# Patient Record
Sex: Female | Born: 1965 | Race: Black or African American | Hispanic: No | Marital: Single | Smoking: Current every day smoker
Health system: Southern US, Community
[De-identification: ages and names within clinical notes are randomized; demographics above are authoritative.]

## PROBLEM LIST (undated history)

## (undated) DIAGNOSIS — F419 Anxiety disorder, unspecified: Secondary | ICD-10-CM

## (undated) DIAGNOSIS — K219 Gastro-esophageal reflux disease without esophagitis: Secondary | ICD-10-CM

## (undated) DIAGNOSIS — K279 Peptic ulcer, site unspecified, unspecified as acute or chronic, without hemorrhage or perforation: Secondary | ICD-10-CM

## (undated) DIAGNOSIS — Z720 Tobacco use: Secondary | ICD-10-CM

## (undated) HISTORY — DX: Peptic ulcer, site unspecified, unspecified as acute or chronic, without hemorrhage or perforation: K27.9

## (undated) HISTORY — DX: Tobacco use: Z72.0

---

## 1999-03-07 ENCOUNTER — Emergency Department (HOSPITAL_COMMUNITY): Admission: EM | Admit: 1999-03-07 | Discharge: 1999-03-08 | Payer: Self-pay | Admitting: Emergency Medicine

## 1999-09-28 ENCOUNTER — Other Ambulatory Visit: Admission: RE | Admit: 1999-09-28 | Discharge: 1999-09-28 | Payer: Self-pay | Admitting: *Deleted

## 2000-07-19 ENCOUNTER — Emergency Department (HOSPITAL_COMMUNITY): Admission: EM | Admit: 2000-07-19 | Discharge: 2000-07-19 | Payer: Self-pay | Admitting: Emergency Medicine

## 2000-07-19 ENCOUNTER — Encounter: Payer: Self-pay | Admitting: Emergency Medicine

## 2001-05-10 ENCOUNTER — Other Ambulatory Visit: Admission: RE | Admit: 2001-05-10 | Discharge: 2001-05-10 | Payer: Self-pay | Admitting: Family Medicine

## 2001-06-14 ENCOUNTER — Encounter: Admission: RE | Admit: 2001-06-14 | Discharge: 2001-06-14 | Payer: Self-pay | Admitting: *Deleted

## 2001-07-09 ENCOUNTER — Ambulatory Visit (HOSPITAL_COMMUNITY): Admission: RE | Admit: 2001-07-09 | Discharge: 2001-07-09 | Payer: Self-pay | Admitting: Obstetrics and Gynecology

## 2001-07-09 ENCOUNTER — Encounter: Payer: Self-pay | Admitting: Obstetrics and Gynecology

## 2002-05-26 ENCOUNTER — Emergency Department (HOSPITAL_COMMUNITY): Admission: EM | Admit: 2002-05-26 | Discharge: 2002-05-26 | Payer: Self-pay | Admitting: Emergency Medicine

## 2003-02-15 ENCOUNTER — Emergency Department (HOSPITAL_COMMUNITY): Admission: EM | Admit: 2003-02-15 | Discharge: 2003-02-15 | Payer: Self-pay | Admitting: *Deleted

## 2003-08-04 ENCOUNTER — Emergency Department (HOSPITAL_COMMUNITY): Admission: EM | Admit: 2003-08-04 | Discharge: 2003-08-04 | Payer: Self-pay | Admitting: Emergency Medicine

## 2003-11-24 ENCOUNTER — Emergency Department (HOSPITAL_COMMUNITY): Admission: EM | Admit: 2003-11-24 | Discharge: 2003-11-25 | Payer: Self-pay | Admitting: Emergency Medicine

## 2004-04-19 ENCOUNTER — Emergency Department (HOSPITAL_COMMUNITY): Admission: EM | Admit: 2004-04-19 | Discharge: 2004-04-19 | Payer: Self-pay | Admitting: Emergency Medicine

## 2005-08-29 ENCOUNTER — Emergency Department (HOSPITAL_COMMUNITY): Admission: EM | Admit: 2005-08-29 | Discharge: 2005-08-29 | Payer: Self-pay | Admitting: Emergency Medicine

## 2010-06-01 ENCOUNTER — Emergency Department (HOSPITAL_COMMUNITY)
Admission: EM | Admit: 2010-06-01 | Discharge: 2010-06-01 | Disposition: A | Discharge status: Home / Self Care | Payer: Self-pay | Attending: Emergency Medicine | Admitting: Emergency Medicine

## 2010-06-01 DIAGNOSIS — K2970 Gastritis, unspecified, without bleeding: Secondary | ICD-10-CM | POA: Insufficient documentation

## 2010-06-01 DIAGNOSIS — R1013 Epigastric pain: Secondary | ICD-10-CM | POA: Insufficient documentation

## 2010-06-01 DIAGNOSIS — N63 Unspecified lump in unspecified breast: Secondary | ICD-10-CM | POA: Insufficient documentation

## 2010-06-01 DIAGNOSIS — F17200 Nicotine dependence, unspecified, uncomplicated: Secondary | ICD-10-CM | POA: Insufficient documentation

## 2012-08-26 ENCOUNTER — Encounter (HOSPITAL_COMMUNITY): Payer: Self-pay | Admitting: Nurse Practitioner

## 2012-08-26 ENCOUNTER — Emergency Department (HOSPITAL_COMMUNITY)
Admission: EM | Admit: 2012-08-26 | Discharge: 2012-08-26 | Disposition: A | Discharge status: Home / Self Care | Payer: Self-pay | Attending: Emergency Medicine | Admitting: Emergency Medicine

## 2012-08-26 DIAGNOSIS — X58XXXA Exposure to other specified factors, initial encounter: Secondary | ICD-10-CM | POA: Insufficient documentation

## 2012-08-26 DIAGNOSIS — Z791 Long term (current) use of non-steroidal anti-inflammatories (NSAID): Secondary | ICD-10-CM | POA: Insufficient documentation

## 2012-08-26 DIAGNOSIS — F17200 Nicotine dependence, unspecified, uncomplicated: Secondary | ICD-10-CM | POA: Insufficient documentation

## 2012-08-26 DIAGNOSIS — Y9389 Activity, other specified: Secondary | ICD-10-CM | POA: Insufficient documentation

## 2012-08-26 DIAGNOSIS — S058X9A Other injuries of unspecified eye and orbit, initial encounter: Secondary | ICD-10-CM | POA: Insufficient documentation

## 2012-08-26 DIAGNOSIS — S0502XA Injury of conjunctiva and corneal abrasion without foreign body, left eye, initial encounter: Secondary | ICD-10-CM

## 2012-08-26 DIAGNOSIS — K219 Gastro-esophageal reflux disease without esophagitis: Secondary | ICD-10-CM | POA: Insufficient documentation

## 2012-08-26 DIAGNOSIS — Z79899 Other long term (current) drug therapy: Secondary | ICD-10-CM | POA: Insufficient documentation

## 2012-08-26 DIAGNOSIS — Z8659 Personal history of other mental and behavioral disorders: Secondary | ICD-10-CM | POA: Insufficient documentation

## 2012-08-26 DIAGNOSIS — Y929 Unspecified place or not applicable: Secondary | ICD-10-CM | POA: Insufficient documentation

## 2012-08-26 HISTORY — DX: Anxiety disorder, unspecified: F41.9

## 2012-08-26 HISTORY — DX: Gastro-esophageal reflux disease without esophagitis: K21.9

## 2012-08-26 MED ORDER — POLYMYXIN B-TRIMETHOPRIM 10000-0.1 UNIT/ML-% OP SOLN: 1.0000 [drp] | OPHTHALMIC | Status: DC

## 2012-08-26 MED ORDER — FLUORESCEIN SODIUM 1 MG OP STRP
ORAL_STRIP | OPHTHALMIC | Status: AC
  Filled 2012-08-26: qty 1

## 2012-08-26 MED ORDER — FLUORESCEIN SODIUM 1 MG OP STRP: 1.0000 | ORAL_STRIP | Freq: Once | OPHTHALMIC | Status: DC

## 2012-08-26 MED ORDER — FLUORESCEIN SODIUM 1 MG OP STRP
1.0000 | ORAL_STRIP | Freq: Once | OPHTHALMIC | Status: AC
  Administered 2012-08-26: 1 via OPHTHALMIC

## 2012-08-26 MED ORDER — TETRACAINE HCL 0.5 % OP SOLN
1.0000 [drp] | Freq: Once | OPHTHALMIC | Status: AC
  Administered 2012-08-26: 1 [drp] via OPHTHALMIC
  Filled 2012-08-26: qty 2

## 2012-08-26 MED ORDER — HYDROCODONE-ACETAMINOPHEN 5-325 MG PO TABS: 2.0000 | ORAL_TABLET | Freq: Four times a day (QID) | ORAL | Status: DC | PRN

## 2012-08-26 NOTE — ED Provider Notes (Signed)
History     CSN: [id number]  Arrival date & time 08/26/12  1357   First MD Initiated Contact with Patient 08/26/12 1442      Chief Complaint  Patient presents with  . Eye Pain    (Consider location/radiation/quality/duration/timing/severity/associated sxs/prior treatment) HPI Comments: Patient presents to the emergency department with chief complaint of irritated watery left eye. She states that started last night. She states that she has had a recent "head cold." She denies fevers, chills, nausea, or vomiting. She has not tried anything to alleviate her symptoms. She states that she has a history of irritated eyes. He states that it feels like she has something in her eye. She reports that it has been draining persistently. It is a clear watery drainage.  The history is provided by the patient. No language interpreter was used.    Past Medical History  Diagnosis Date  . Anxiety   . Acid reflux     History reviewed. No pertinent past surgical history.  History reviewed. No pertinent family history.  History  Substance Use Topics  . Smoking status: Current Every Day Smoker    Types: Cigarettes  . Smokeless tobacco: Not on file  . Alcohol Use: No    OB History   Grav Para Term Preterm Abortions TAB SAB Ect Mult Living                  Review of Systems  All other systems reviewed and are negative.    Allergies  Review of patient's allergies indicates no known allergies.  Home Medications   Current Outpatient Rx  Name  Route  Sig  Dispense  Refill  . cholecalciferol (VITAMIN D) 1000 UNITS tablet   Oral   Take 1,000 Units by mouth 2 (two) times daily.         . meloxicam (MOBIC) 15 MG tablet   Oral   Take 15 mg by mouth daily.         . pantoprazole (PROTONIX) 40 MG tablet   Oral   Take 40 mg by mouth daily.         . ranitidine (ZANTAC) 150 MG capsule   Oral   Take 150 mg by mouth once.         Marland Kitchen zolpidem (AMBIEN) 5 MG tablet   Oral  Take 5 mg by mouth at bedtime as needed for sleep.           BP 128/78  Pulse 63  Temp(Src) 97.3 F (36.3 C) (Oral)  Resp 15  SpO2 99%  Physical Exam  Nursing note and vitals reviewed. Constitutional: She is oriented to person, place, and time. She appears well-developed and well-nourished.  HENT:  Head: Normocephalic and atraumatic.  Eyes: Conjunctivae and EOM are normal.    No obvious foreign bodies, small area of fluorescein uptake, eyelid everted, Visual Acuity  -  Bilateral Distance:  20/30 ; R Distance:  20/30 ; L Distance:  20/30  Neck: Normal range of motion.  Cardiovascular: Normal rate.   Pulmonary/Chest: Effort normal.  Abdominal: She exhibits no distension.  Musculoskeletal: Normal range of motion.  Neurological: She is alert and oriented to person, place, and time.  Skin: Skin is dry.  Psychiatric: She has a normal mood and affect. Her behavior is normal. Judgment and thought content normal.    ED Course  Procedures (including critical care time)  Labs Reviewed - No data to display No results found.   1. Corneal abrasion, left,  initial encounter       MDM  Patient with area of fluorescein uptake, concern for abrasion. Will treat with Polytrim eyedrops, and Vicodin. Followup with ophthalmology tomorrow.        Roxy Horseman, PA-C 08/26/12 1533

## 2012-08-26 NOTE — ED Notes (Signed)
Pt c/o pain, burning, and blurred vision from L eye since lastnight. Denies any injuries. States she recently had a "head cold."

## 2012-08-28 NOTE — ED Provider Notes (Signed)
Medical screening examination/treatment/procedure(s) were performed by non-physician practitioner and as supervising physician I was immediately available for consultation/collaboration.  Derwood Kaplan, MD 08/28/12 [phone]

## 2012-09-12 ENCOUNTER — Other Ambulatory Visit: Payer: Self-pay | Admitting: Obstetrics and Gynecology

## 2012-09-12 DIAGNOSIS — Z1231 Encounter for screening mammogram for malignant neoplasm of breast: Secondary | ICD-10-CM

## 2012-09-25 ENCOUNTER — Inpatient Hospital Stay (HOSPITAL_COMMUNITY): Admission: RE | Admit: 2012-09-25 | Payer: Self-pay | Source: Ambulatory Visit

## 2012-09-25 ENCOUNTER — Ambulatory Visit (HOSPITAL_COMMUNITY): Payer: Self-pay | Attending: Obstetrics and Gynecology

## 2012-12-22 ENCOUNTER — Other Ambulatory Visit (HOSPITAL_COMMUNITY): Payer: Self-pay | Admitting: *Deleted

## 2012-12-22 DIAGNOSIS — N6320 Unspecified lump in the left breast, unspecified quadrant: Secondary | ICD-10-CM

## 2012-12-25 ENCOUNTER — Ambulatory Visit (HOSPITAL_COMMUNITY)
Admission: RE | Admit: 2012-12-25 | Discharge: 2012-12-25 | Disposition: A | Discharge status: Home / Self Care | Payer: Self-pay | Source: Ambulatory Visit | Attending: Obstetrics and Gynecology | Admitting: Obstetrics and Gynecology

## 2012-12-25 ENCOUNTER — Encounter (HOSPITAL_COMMUNITY): Payer: Self-pay

## 2012-12-25 ENCOUNTER — Encounter (INDEPENDENT_AMBULATORY_CARE_PROVIDER_SITE_OTHER): Payer: Self-pay

## 2012-12-25 VITALS — BP 116/68 | Temp 98.4°F | Ht 66.0 in | Wt 158.2 lb

## 2012-12-25 DIAGNOSIS — Z01419 Encounter for gynecological examination (general) (routine) without abnormal findings: Secondary | ICD-10-CM

## 2012-12-25 NOTE — Patient Instructions (Signed)
Taught Anita Obrien how to perform BSE and gave educational materials to take home.  Let her know [facility] will cover Pap smears every 3 years unless has a history of abnormal Pap smears. Referred patient to the [facility] for diagnostic mammogram. Appointment scheduled for Monday, December 31, 2012 at 1345. Patient aware of appointment and will be there. Let patient know will follow up with her within the next couple weeks with results for Pap smear by phone. Anita Obrien verbalized understanding.  Anita Obrien, Anita Maser, RN 8:46 AM

## 2012-12-25 NOTE — Progress Notes (Signed)
Complaints of left breast lump x 2 years that patient states has gotten larger within the last month. Patient complains of left breast pain that comes and goes that started a month ago. Patient rates pain at a 7 out of 10.  Pap Smear:    Pap smear completed today. Patients last Pap smear was 4 years ago at [facility] and normal per patient. Per patient no history of an abnormal Pap smear. No Pap smear results in EPIC.  Physical exam: Breasts Breasts symmetrical. No skin abnormalities right breast. Darkened area observed on left breast at 9 o'clock next to areola. Nipple retraction bilateral breasts per patient is normal for her. No nipple discharge bilateral breasts. No lymphadenopathy. No lumps palpated bilateral breasts. No lumps were palpated at patients area of concern. Patient complained of some tenderness when palpated left breast at areola and at 9 o'clock where area of darkened skin observed.Referred patient to the [facility] for diagnostic mammogram. Appointment scheduled for Monday, December 31, 2012 at 1345.          Pelvic/Bimanual   Ext Genitalia No lesions, no swelling and no discharge observed on external genitalia.         Vagina Vagina pink and normal texture. No lesions or discharge observed in vagina.          Cervix Cervix is present. Cervix pink and of normal texture. Cervix friable. No discharge observed.     Uterus Uterus is present and palpable. Uterus in normal position and normal size.        Adnexae Bilateral ovaries present and palpable. No tenderness on palpation.          Rectovaginal No rectal exam completed today since patient had no rectal complaints. No skin abnormalities observed on exam.

## 2012-12-31 ENCOUNTER — Other Ambulatory Visit: Payer: Self-pay

## 2013-01-04 ENCOUNTER — Telehealth (HOSPITAL_COMMUNITY): Payer: Self-pay | Admitting: *Deleted

## 2013-01-04 NOTE — Telephone Encounter (Signed)
Telephoned patient at home # and discussed negative pap smear results. Next pap smear due in 3 years. Patient voiced understanding.  

## 2013-01-16 ENCOUNTER — Ambulatory Visit
Admission: RE | Admit: 2013-01-16 | Discharge: 2013-01-16 | Disposition: A | Discharge status: Home / Self Care | Payer: No Typology Code available for payment source | Source: Ambulatory Visit | Attending: Obstetrics and Gynecology | Admitting: Obstetrics and Gynecology

## 2013-01-16 DIAGNOSIS — N6320 Unspecified lump in the left breast, unspecified quadrant: Secondary | ICD-10-CM

## 2014-01-06 ENCOUNTER — Encounter (HOSPITAL_COMMUNITY): Payer: Self-pay

## 2014-08-30 ENCOUNTER — Emergency Department (HOSPITAL_COMMUNITY): Payer: Managed Care, Other (non HMO)

## 2014-08-30 ENCOUNTER — Encounter (HOSPITAL_COMMUNITY): Payer: Self-pay | Admitting: General Practice

## 2014-08-30 ENCOUNTER — Emergency Department (HOSPITAL_COMMUNITY)
Admission: EM | Admit: 2014-08-30 | Discharge: 2014-08-30 | Disposition: A | Discharge status: Home / Self Care | Payer: Managed Care, Other (non HMO) | Attending: Emergency Medicine | Admitting: Emergency Medicine

## 2014-08-30 DIAGNOSIS — Z720 Tobacco use: Secondary | ICD-10-CM | POA: Insufficient documentation

## 2014-08-30 DIAGNOSIS — Z79899 Other long term (current) drug therapy: Secondary | ICD-10-CM | POA: Insufficient documentation

## 2014-08-30 DIAGNOSIS — K219 Gastro-esophageal reflux disease without esophagitis: Secondary | ICD-10-CM | POA: Insufficient documentation

## 2014-08-30 DIAGNOSIS — R1084 Generalized abdominal pain: Secondary | ICD-10-CM | POA: Diagnosis present

## 2014-08-30 DIAGNOSIS — F419 Anxiety disorder, unspecified: Secondary | ICD-10-CM | POA: Diagnosis not present

## 2014-08-30 DIAGNOSIS — R05 Cough: Secondary | ICD-10-CM | POA: Insufficient documentation

## 2014-08-30 DIAGNOSIS — R112 Nausea with vomiting, unspecified: Secondary | ICD-10-CM | POA: Diagnosis not present

## 2014-08-30 DIAGNOSIS — R197 Diarrhea, unspecified: Secondary | ICD-10-CM | POA: Insufficient documentation

## 2014-08-30 DIAGNOSIS — M791 Myalgia: Secondary | ICD-10-CM | POA: Insufficient documentation

## 2014-08-30 DIAGNOSIS — K029 Dental caries, unspecified: Secondary | ICD-10-CM | POA: Insufficient documentation

## 2014-08-30 DIAGNOSIS — Z791 Long term (current) use of non-steroidal anti-inflammatories (NSAID): Secondary | ICD-10-CM | POA: Insufficient documentation

## 2014-08-30 DIAGNOSIS — R059 Cough, unspecified: Secondary | ICD-10-CM

## 2014-08-30 LAB — CBC WITH DIFFERENTIAL/PLATELET
BASOS ABS: 0 10*3/uL (ref 0.0–0.1)
BASOS PCT: 0 % (ref 0–1)
Eosinophils Absolute: 0.1 10*3/uL (ref 0.0–0.7)
Eosinophils Relative: 2 % (ref 0–5)
HEMATOCRIT: 38.1 % (ref 36.0–46.0)
Hemoglobin: 12.8 g/dL (ref 12.0–15.0)
LYMPHS ABS: 2 10*3/uL (ref 0.7–4.0)
Lymphocytes Relative: 32 % (ref 12–46)
MCH: 31.5 pg (ref 26.0–34.0)
MCHC: 33.6 g/dL (ref 30.0–36.0)
MCV: 93.8 fL (ref 78.0–100.0)
MONO ABS: 0.7 10*3/uL (ref 0.1–1.0)
Monocytes Relative: 10 % (ref 3–12)
NEUTROS ABS: 3.5 10*3/uL (ref 1.7–7.7)
Neutrophils Relative %: 56 % (ref 43–77)
Platelets: 333 10*3/uL (ref 150–400)
RBC: 4.06 MIL/uL (ref 3.87–5.11)
RDW: 12.8 % (ref 11.5–15.5)
WBC: 6.3 10*3/uL (ref 4.0–10.5)

## 2014-08-30 LAB — COMPREHENSIVE METABOLIC PANEL
ALBUMIN: 3.8 g/dL (ref 3.5–5.0)
ALK PHOS: 78 U/L (ref 38–126)
ALT: 13 U/L — ABNORMAL LOW (ref 14–54)
ANION GAP: 6 (ref 5–15)
AST: 22 U/L (ref 15–41)
BUN: 16 mg/dL (ref 6–20)
CHLORIDE: 107 mmol/L (ref 101–111)
CO2: 25 mmol/L (ref 22–32)
Calcium: 9.3 mg/dL (ref 8.9–10.3)
Creatinine, Ser: 0.93 mg/dL (ref 0.44–1.00)
GFR calc Af Amer: >60 mL/min (ref >60)
GFR calc non Af Amer: >60 mL/min (ref >60)
Glucose, Bld: 99 mg/dL (ref 65–99)
Potassium: 3.8 mmol/L (ref 3.5–5.1)
SODIUM: 138 mmol/L (ref 135–145)
Total Bilirubin: 0.4 mg/dL (ref 0.3–1.2)
Total Protein: 7.2 g/dL (ref 6.5–8.1)

## 2014-08-30 LAB — I-STAT TROPONIN, ED
TROPONIN I, POC: 0.02 ng/mL (ref 0.00–0.08)
Troponin i, poc: 0 ng/mL (ref 0.00–0.08)

## 2014-08-30 LAB — I-STAT CG4 LACTIC ACID, ED: Lactic Acid, Venous: 0.71 mmol/L (ref 0.5–2.0)

## 2014-08-30 LAB — LIPASE, BLOOD: Lipase: 21 U/L — ABNORMAL LOW (ref 22–51)

## 2014-08-30 MED ORDER — MORPHINE SULFATE 4 MG/ML IJ SOLN
4.0000 mg | Freq: Once | INTRAMUSCULAR | Status: AC
  Administered 2014-08-30: 4 mg via INTRAVENOUS
  Filled 2014-08-30: qty 1

## 2014-08-30 MED ORDER — SODIUM CHLORIDE 0.9 % IV BOLUS (SEPSIS)
1000.0000 mL | Freq: Once | INTRAVENOUS | Status: AC
  Administered 2014-08-30: 1000 mL via INTRAVENOUS

## 2014-08-30 MED ORDER — ONDANSETRON HCL 4 MG PO TABS: 4.0000 mg | ORAL_TABLET | Freq: Four times a day (QID) | ORAL | Status: DC

## 2014-08-30 MED ORDER — PANTOPRAZOLE SODIUM 40 MG PO TBEC: 40.0000 mg | DELAYED_RELEASE_TABLET | Freq: Every day | ORAL | Status: DC

## 2014-08-30 MED ORDER — ONDANSETRON HCL 4 MG/2ML IJ SOLN
4.0000 mg | Freq: Once | INTRAMUSCULAR | Status: AC
  Administered 2014-08-30: 4 mg via INTRAVENOUS
  Filled 2014-08-30: qty 2

## 2014-08-30 MED ORDER — RANITIDINE HCL 150 MG PO CAPS: 150.0000 mg | ORAL_CAPSULE | Freq: Two times a day (BID) | ORAL | Status: DC

## 2014-08-30 NOTE — ED Notes (Signed)
EDP at bedside  

## 2014-08-30 NOTE — ED Notes (Signed)
Pt comfortable with discharge and follow up instructions. Pt declines wheelchair, escorted to waiting area by this RN. Prescriptions x3. 

## 2014-08-30 NOTE — ED Provider Notes (Signed)
CSN: [id number]     Arrival date & time 08/30/14  1021 History   First MD Initiated Contact with Patient 08/30/14 1032     No chief complaint on file.    (Consider location/radiation/quality/duration/timing/severity/associated sxs/prior Treatment) HPI Comments: Pt felt fine all day and then last night started to have vomiting with blood streaks and diarrhea which was normal in color.  She had multiple episodes of vomiting throughout the night and diarrhea.  She is having diffuse abd pain which started after the vomiting.  Also when vomits coughs and there is some blood in it.  She states after several episodes of vomiting she developed substernal chest pain.  Denies SOB and not currently having pain in the chest.  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 She takes no medication at this time except for occasional Goody's powder. She does have a history of stomach ulcers but no longer takes Protonix or Zantac.  She states her diarrhea is light in color without any black stools or blood in the stool.  Patient denies any urinary symptoms or vaginal discharge.  Patient is a 49 y.o. female presenting with vomiting. The history is provided by the patient.  Emesis Severity:  Severe Duration:  12 hours Timing:  Constant Number of daily episodes:  Numerous Quality:  Stomach contents and bright red blood (blood streaked vomit.  No clots or large volume of blood.  Does not turn the toilet bowl red) Progression:  Unchanged Chronicity:  New Relieved by:  Nothing Worsened by:  Ice chips and liquids Ineffective treatments:  None tried Associated symptoms: abdominal pain, cough, diarrhea and myalgias   Associated symptoms: no fever, no sore throat and no URI   Risk factors: alcohol use   Risk factors: no diabetes, not pregnant now, no prior abdominal surgery and no sick contacts   Risk factors comment:  Drinks a 40oz a day for 20 years   Past Medical History  Diagnosis Date  .  Anxiety   . Acid reflux    No past surgical history on file. Family History  Problem Relation Age of Onset  . Breast cancer Sister    History  Substance Use Topics  . Smoking status: Current Every Day Smoker    Types: Cigarettes  . Smokeless tobacco: Not on file  . Alcohol Use: No   OB History    Gravida Para Term Preterm AB TAB SAB Ectopic Multiple Living   2 2 2       2      Review of Systems  HENT: Negative for sore throat.   Gastrointestinal: Positive for vomiting, abdominal pain and diarrhea.  Musculoskeletal: Positive for myalgias.  All other systems reviewed and are negative.     Allergies  Review of patient's allergies indicates no known allergies.  Home Medications   Prior to Admission medications   Medication Sig Start Date End Date Taking? Authorizing Provider  cholecalciferol (VITAMIN D) 1000 UNITS tablet Take 1,000 Units by mouth 2 (two) times daily.    Historical Provider, MD  HYDROcodone-acetaminophen (NORCO/VICODIN) 5-325 MG per tablet Take 2 tablets by mouth every 6 (six) hours as needed for pain. 08/26/12   Roxy Horseman, PA-C  meloxicam (MOBIC) 15 MG tablet Take 15 mg by mouth daily.    Historical Provider, MD  pantoprazole (PROTONIX) 40 MG tablet Take 40 mg by mouth daily.    Historical Provider, MD  ranitidine (ZANTAC) 150 MG capsule Take 150 mg by mouth once.  Historical Provider, MD  trimethoprim-polymyxin b (POLYTRIM) ophthalmic solution Place 1 drop into the left eye every 4 (four) hours. 08/26/12   Roxy Horseman, PA-C  zolpidem (AMBIEN) 5 MG tablet Take 5 mg by mouth at bedtime as needed for sleep.    Historical Provider, MD   There were no vitals taken for this visit. Physical Exam  Constitutional: She is oriented to person, place, and time. She appears well-developed and well-nourished. No distress.  HENT:  Head: Normocephalic and atraumatic.  Mouth/Throat: Oropharynx is clear and moist. Mucous membranes are dry. Dental caries present.      Eyes: Conjunctivae and EOM are normal. Pupils are equal, round, and reactive to light.  Neck: Normal range of motion. Neck supple.  Cardiovascular: Normal rate, regular rhythm and intact distal pulses.   No murmur heard. Pulmonary/Chest: Effort normal and breath sounds normal. No respiratory distress. She has no wheezes. She has no rales.  Abdominal: Soft. She exhibits no distension. There is tenderness. There is no rebound and no guarding.  Mild diffuse tenderness.  Musculoskeletal: Normal range of motion. She exhibits no edema or tenderness.  Neurological: She is alert and oriented to person, place, and time.  Skin: Skin is warm and dry. No rash noted. No erythema.  Psychiatric: She has a normal mood and affect. Her behavior is normal.  Nursing note and vitals reviewed.   ED Course  Procedures (including critical care time) Labs Review Labs Reviewed  COMPREHENSIVE METABOLIC PANEL - Abnormal; Notable for the following:    ALT 13 (*)    All other components within normal limits  LIPASE, BLOOD - Abnormal; Notable for the following:    Lipase 21 (*)    All other components within normal limits  CBC WITH DIFFERENTIAL/PLATELET  I-STAT TROPOININ, ED  I-STAT CG4 LACTIC ACID, ED  Rosezena Sensor, ED    Imaging Review Dg Chest 2 View  08/30/2014   CLINICAL DATA:  Hemoptysis and midsternal chest pain since yesterday.  EXAM: CHEST  2 VIEW  COMPARISON:  None available  FINDINGS: The heart size and mediastinal contours are within normal limits. Both lungs are clear. The visualized skeletal structures are unremarkable.  IMPRESSION: No active cardiopulmonary disease.   Electronically Signed   By: Judie Petit.  Shick M.D.   On: 08/30/2014 12:23     EKG Interpretation   Date/Time:  Saturday August 30 2014 11:15:40 EDT Ventricular Rate:  70 PR Interval:  160 QRS Duration: 138 QT Interval:  496 QTC Calculation: 535 R Axis:   43 Text Interpretation:  Sinus rhythm Left bundle branch block No  previous  tracing Confirmed by Anitra Lauth  MD, Alphonzo Lemmings ([id number]) on 08/30/2014 11:18:36  AM      MDM   Final diagnoses:  None    Patient with a history of vomiting and diarrhea that started last night with diffuse abdominal tenderness. Patient states that she is vomiting there is blood streaks in her vomit but denies copious blood, blood clots are dark red blood in her vomit. She denies black stools and her diarrhea is brown in color without blood. She does drink a 40 of beer every day for the last 20 years and uses cigarettes. She denies any abdominal surgeries. She currently takes no medications and occasionally will use a Goody's powder or ibuprofen but not regularly.  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 No allergies.  Patient's symptoms seem most closely related to a viral etiology however potential for pancreatitis, hepatitis. Based on  exam and symptoms low suspicion for cholecystitis or appendicitis. Patient denies any urinary symptoms or vaginal discharge. Low suspicion that this is variceal bleeding as patient just has blood streaks which is most likely a Mallory-Weiss tear.  Also patient states she started developing left-sided chest pain after multiple episodes of vomiting. She denies any shortness of breath.  No current chest pain now.  CBC, CMP, lipase, troponin, lactate, EKG pending.  Patient given IV fluids and Zofran.  12:20 PM All labs are within normal limits. Patient does have a left bundle branch block on EKG without prior to compare however this time she has no chest pain and low suspicion that her left bundle branch block is caused by an acute coronary syndrome at this time.  1:07 PM On reevaluation patient is feeling better but states the pain returned in her left chest which is reproducible by palpation. She states occasionally she will get this pain over the last year intermittently it's never caused by exertion and she denies any shortness of breath  associated with it. Will repeat a troponin and give patient a fluid challenge.  3:24 PM Second trop neg.  Pt tolerating po's.  Will d/c home.  Gwyneth Sprout, MD 08/30/14 1524

## 2014-08-30 NOTE — ED Notes (Signed)
Pt unable to complete fluid challenge.

## 2014-08-30 NOTE — ED Notes (Signed)
Patient transported to X-ray 

## 2014-08-30 NOTE — ED Notes (Signed)
Pt complaining of abdominal pain N/V/D. Pt took a goody powder yesterday around 1900 and around 2030 pt starting having N/V. Pt reports vomited about 7 times since then, and 2 diarrhea occurences. Pt denies any fever or chills. Pt is A/O. Pt not able to tolerate fluids or solids.

## 2014-10-20 ENCOUNTER — Encounter (HOSPITAL_COMMUNITY): Payer: Self-pay

## 2014-10-20 ENCOUNTER — Emergency Department (HOSPITAL_COMMUNITY): Payer: Managed Care, Other (non HMO)

## 2014-10-20 ENCOUNTER — Emergency Department (HOSPITAL_COMMUNITY)
Admission: EM | Admit: 2014-10-20 | Discharge: 2014-10-20 | Disposition: A | Discharge status: Home / Self Care | Payer: Managed Care, Other (non HMO) | Attending: Emergency Medicine | Admitting: Emergency Medicine

## 2014-10-20 DIAGNOSIS — Z791 Long term (current) use of non-steroidal anti-inflammatories (NSAID): Secondary | ICD-10-CM | POA: Diagnosis not present

## 2014-10-20 DIAGNOSIS — Z79899 Other long term (current) drug therapy: Secondary | ICD-10-CM | POA: Diagnosis not present

## 2014-10-20 DIAGNOSIS — Z720 Tobacco use: Secondary | ICD-10-CM | POA: Diagnosis not present

## 2014-10-20 DIAGNOSIS — R079 Chest pain, unspecified: Secondary | ICD-10-CM | POA: Diagnosis present

## 2014-10-20 DIAGNOSIS — Z8659 Personal history of other mental and behavioral disorders: Secondary | ICD-10-CM | POA: Diagnosis not present

## 2014-10-20 DIAGNOSIS — K219 Gastro-esophageal reflux disease without esophagitis: Secondary | ICD-10-CM | POA: Insufficient documentation

## 2014-10-20 DIAGNOSIS — K2970 Gastritis, unspecified, without bleeding: Secondary | ICD-10-CM | POA: Diagnosis not present

## 2014-10-20 LAB — CBC
HEMATOCRIT: 40.1 % (ref 36.0–46.0)
HEMOGLOBIN: 13.7 g/dL (ref 12.0–15.0)
MCH: 32.4 pg (ref 26.0–34.0)
MCHC: 34.2 g/dL (ref 30.0–36.0)
MCV: 94.8 fL (ref 78.0–100.0)
Platelets: 344 10*3/uL (ref 150–400)
RBC: 4.23 MIL/uL (ref 3.87–5.11)
RDW: 13.2 % (ref 11.5–15.5)
WBC: 6.3 10*3/uL (ref 4.0–10.5)

## 2014-10-20 LAB — BASIC METABOLIC PANEL
ANION GAP: 9 (ref 5–15)
BUN: 11 mg/dL (ref 6–20)
CO2: 21 mmol/L — ABNORMAL LOW (ref 22–32)
Calcium: 9 mg/dL (ref 8.9–10.3)
Chloride: 104 mmol/L (ref 101–111)
Creatinine, Ser: 1.02 mg/dL — ABNORMAL HIGH (ref 0.44–1.00)
Glucose, Bld: 98 mg/dL (ref 65–99)
POTASSIUM: 3.8 mmol/L (ref 3.5–5.1)
SODIUM: 134 mmol/L — AB (ref 135–145)

## 2014-10-20 LAB — I-STAT TROPONIN, ED
TROPONIN I, POC: 0 ng/mL (ref 0.00–0.08)
Troponin i, poc: 0 ng/mL (ref 0.00–0.08)

## 2014-10-20 MED ORDER — GI COCKTAIL ~~LOC~~: 30.0000 mL | Freq: Two times a day (BID) | ORAL | Status: DC

## 2014-10-20 MED ORDER — PANTOPRAZOLE SODIUM 20 MG PO TBEC: 20.0000 mg | DELAYED_RELEASE_TABLET | Freq: Every day | ORAL | Status: DC

## 2014-10-20 MED ORDER — GI COCKTAIL ~~LOC~~
30.0000 mL | Freq: Once | ORAL | Status: AC
  Administered 2014-10-20: 30 mL via ORAL
  Filled 2014-10-20: qty 30

## 2014-10-20 MED ORDER — PANTOPRAZOLE SODIUM 40 MG IV SOLR
40.0000 mg | INTRAVENOUS | Status: AC
  Administered 2014-10-20: 40 mg via INTRAVENOUS
  Filled 2014-10-20: qty 40

## 2014-10-20 NOTE — ED Notes (Signed)
Pt states she has ulcers and started having cp yesterday morning and has bothered her all day. Takes zantac 150 and it usually helps but it didn't this time.

## 2014-10-20 NOTE — ED Notes (Signed)
Pt stable, ambulatory, states understanding of discharge instructions 

## 2014-10-20 NOTE — ED Provider Notes (Signed)
CSN: [id number]     Arrival date & time 10/20/14  0053 History   First MD Initiated Contact with Patient 10/20/14 0434     Chief Complaint  Patient presents with  . Chest Pain     (Consider location/radiation/quality/duration/timing/severity/associated sxs/prior Treatment) HPI Comments: Patient is a 49 year old female with a past medical history of anxiety, gastric ulcers and acid reflux who presents with a 2 day history chest pain. The pain is located in her central chest and radiates up and down the center of her chest. The pain is described as burning and severe. The pain started gradually and progressively worsened since the onset. No alleviating/aggravating factors. The patient has tried zantac for symptoms without relief. Associated symptoms include nothing. Patient denies fever, headache, NVD, SOB, dysuria, constipation.  Patient is a 49 y.o. female presenting with chest pain.  Chest Pain Associated symptoms: no abdominal pain, no dizziness, no dysphagia, no fatigue, no fever, no nausea, no palpitations, no shortness of breath, not vomiting and no weakness     Past Medical History  Diagnosis Date  . Anxiety   . Acid reflux    History reviewed. No pertinent past surgical history. Family History  Problem Relation Age of Onset  . Breast cancer Sister    Social History  Substance Use Topics  . Smoking status: Current Every Day Smoker -- 0.50 packs/day    Types: Cigarettes  . Smokeless tobacco: None  . Alcohol Use: 2.4 oz/week    4 Cans of beer per week   OB History    Gravida Para Term Preterm AB TAB SAB Ectopic Multiple Living   Review of Systems  Constitutional: Negative for fever, chills and fatigue.  HENT: Negative for trouble swallowing.   Eyes: Negative for visual disturbance.  Respiratory: Negative for shortness of breath.   Cardiovascular: Positive for chest pain. Negative for palpitations.  Gastrointestinal: Negative for nausea, vomiting,  abdominal pain and diarrhea.  Genitourinary: Negative for dysuria and difficulty urinating.  Musculoskeletal: Negative for arthralgias and neck pain.  Skin: Negative for color change.  Neurological: Negative for dizziness and weakness.  Psychiatric/Behavioral: Negative for dysphoric mood.      Allergies  Review of patient's allergies indicates no known allergies.  Home Medications   Prior to Admission medications   Medication Sig Start Date End Date Taking? Authorizing Provider  cholecalciferol (VITAMIN D) 1000 UNITS tablet Take 1,000 Units by mouth 2 (two) times daily.   Yes Historical Provider, MD  meloxicam (MOBIC) 15 MG tablet Take 15 mg by mouth daily.   Yes Historical Provider, MD  pantoprazole (PROTONIX) 40 MG tablet Take 1 tablet (40 mg total) by mouth daily. 08/30/14  Yes Gwyneth Sprout, MD  ranitidine (ZANTAC) 150 MG capsule Take 1 capsule (150 mg total) by mouth 2 (two) times daily. 08/30/14  Yes Gwyneth Sprout, MD  zolpidem (AMBIEN) 5 MG tablet Take 5 mg by mouth at bedtime as needed for sleep.   Yes Historical Provider, MD  HYDROcodone-acetaminophen (NORCO/VICODIN) 5-325 MG per tablet Take 2 tablets by mouth every 6 (six) hours as needed for pain. Patient not taking: Reported on 10/20/2014 08/26/12   Roxy Horseman, PA-C  ondansetron (ZOFRAN) 4 MG tablet Take 1 tablet (4 mg total) by mouth every 6 (six) hours. Patient not taking: Reported on 10/20/2014 08/30/14   Gwyneth Sprout, MD  trimethoprim-polymyxin b (POLYTRIM) ophthalmic solution Place 1 drop into the left eye  every 4 (four) hours. Patient not taking: Reported on 10/20/2014 08/26/12   Roxy Horseman, PA-C   BP 110/66 mmHg  Pulse 65  Temp(Src) 98.2 F (36.8 C) (Oral)  Resp 15  Ht 5\' 7"  (1.702 m)  Wt 165 lb (74.844 kg)  BMI 25.84 kg/m2  SpO2 100%  LMP 08/16/2014 Physical Exam  Constitutional: She is oriented to person, place, and time. She appears well-developed and well-nourished. No distress.  HENT:   Head: Normocephalic and atraumatic.  Eyes: Conjunctivae and EOM are normal.  Neck: Normal range of motion.  Cardiovascular: Normal rate and regular rhythm.  Exam reveals no gallop and no friction rub.   No murmur heard. Pulmonary/Chest: Effort normal and breath sounds normal. She has no wheezes. She has no rales. She exhibits no tenderness.  Abdominal: Soft. She exhibits no distension. There is no tenderness. There is no rebound.  Musculoskeletal: Normal range of motion.  Neurological: She is alert and oriented to person, place, and time. Coordination normal.  Speech is goal-oriented. Moves limbs without ataxia.   Skin: Skin is warm and dry.  Psychiatric: She has a normal mood and affect. Her behavior is normal.  Nursing note and vitals reviewed.   ED Course  Procedures (including critical care time) Labs Review Labs Reviewed  BASIC METABOLIC PANEL - Abnormal; Notable for the following:    Sodium 134 (*)    CO2 21 (*)    Creatinine, Ser 1.02 (*)    All other components within normal limits  CBC  I-STAT TROPOININ, ED    Imaging Review Dg Chest 2 View  10/20/2014   CLINICAL DATA:  Shortness of breath and chest pain  EXAM: CHEST  2 VIEW  COMPARISON:  08/30/2014  FINDINGS: Normal heart size and mediastinal contours. No acute infiltrate or edema. No effusion or pneumothorax. No acute osseous findings.  IMPRESSION: No active cardiopulmonary disease.   Electronically Signed   By: Marnee Spring M.D.   On: 10/20/2014 02:16   I, Emilia Beck, personally reviewed and evaluated these images and lab results as part of my medical decision-making.   EKG Interpretation None      MDM   Final diagnoses:  Gastritis    6:09 AM Patient's labs unremarkable for acute changes. Chest xray unremarkable for acute changes. Vitals stable and patient afebrile. Patient's pain likely due to gastric ulcer. Patient treated with GI cocktail and protonix.     Emilia Beck, PA-C 10/21/14  0601  April Palumbo, MD 10/21/14 [phone]

## 2014-10-20 NOTE — Discharge Instructions (Signed)
Take protonix daily for pain. Take GI cocktail as needed. Refer to attached documents for more information.

## 2016-10-23 ENCOUNTER — Ambulatory Visit (HOSPITAL_COMMUNITY)
Admission: EM | Admit: 2016-10-23 | Discharge: 2016-10-23 | Disposition: A | Discharge status: Home / Self Care | Payer: Self-pay | Attending: Family Medicine | Admitting: Family Medicine

## 2016-10-23 ENCOUNTER — Encounter (HOSPITAL_COMMUNITY): Payer: Self-pay | Admitting: Emergency Medicine

## 2016-10-23 DIAGNOSIS — K219 Gastro-esophageal reflux disease without esophagitis: Secondary | ICD-10-CM

## 2016-10-23 MED ORDER — ONDANSETRON 4 MG PO TBDP
4.0000 mg | ORAL_TABLET | Freq: Once | ORAL | Status: AC
  Administered 2016-10-23: 4 mg via ORAL

## 2016-10-23 MED ORDER — RANITIDINE HCL 300 MG PO TABS: 300.0000 mg | ORAL_TABLET | Freq: Every day | ORAL | 2 refills | Status: DC

## 2016-10-23 MED ORDER — GI COCKTAIL ~~LOC~~
30.0000 mL | Freq: Once | ORAL | Status: AC
  Administered 2016-10-23: 30 mL via ORAL

## 2016-10-23 MED ORDER — ONDANSETRON 4 MG PO TBDP
ORAL_TABLET | ORAL | Status: AC
  Filled 2016-10-23: qty 1

## 2016-10-23 MED ORDER — GI COCKTAIL ~~LOC~~
ORAL | Status: AC
  Filled 2016-10-23: qty 30

## 2016-10-23 MED ORDER — OMEPRAZOLE 40 MG PO CPDR: 40.0000 mg | DELAYED_RELEASE_CAPSULE | Freq: Two times a day (BID) | ORAL | 0 refills | Status: DC

## 2016-10-23 NOTE — Discharge Instructions (Signed)
For your knee pain, this is consistent with arthritis, take Tylenol as needed, rest, eating patterns as needed.  For your reflux, avoid nonsteroidals such as ibuprofen, Motrin, Aleve, given the 2 week supply of Prilosec, take twice daily, and I have increased her Zantac dosage. Take one tablet every night at bedtime. If pain persists, follow-up with you primary care provider

## 2016-10-23 NOTE — ED Provider Notes (Signed)
  [facility]   [id number] 10/23/16 Arrival Time: 1544   SUBJECTIVE:  Anita Obrien is a 51 y.o. female who presents to the urgent care  with complaint of stomach pains ongoing for 6 days. She has a past history of stomach ulcer, takes Zantac 150 mg daily. States in the past this has helped but no longer. Pain is worse when she is lying supine, and after certain foods. Also states a sensation of fullness in her throat when she is lying down. She is also complaining of knee pain that started today. This is bilateral both knees, worse with movement. Denies any history of trauma. She works as a Advertising copywriter, states that she did not go to work today due to the pain.  ROS: As per HPI, remainder of ROS negative.   OBJECTIVE:  Vitals:   10/23/16 1556  BP: (!) 108/59  Pulse: 72  Resp: 18  Temp: 98.3 F (36.8 C)  TempSrc: Oral  SpO2: 98%     General appearance: alert; no distress HEENT: normocephalic; atraumatic; conjunctivae normal;  Lungs: clear to auscultation bilaterally Heart: regular rate and rhythm Abdomen: soft, Epigastric tenderness, negative Murphy, negative McBurney no rebound; bowel sounds normal; no masses or organomegaly; no guarding Musculoskeletal: Bilaterally with both knees, no abscess normal patellar instability, negative ballottement, no instability with lateral or medial pressure. Negative drawer sign  Skin: warm and dry Neurologic: Grossly normal Psychological:  alert and cooperative; normal mood and affect     ASSESSMENT & PLAN:  1. Gastroesophageal reflux disease, esophagitis presence not specified     Meds ordered this encounter  Medications  . gi cocktail (Maalox,Lidocaine,Donnatal)  . ondansetron (ZOFRAN-ODT) disintegrating tablet 4 mg  . omeprazole (PRILOSEC) 40 MG capsule    Sig: Take 1 capsule (40 mg total) by mouth 2 (two) times daily.    Dispense:  28 capsule    Refill:  0    Order Specific Question:   Supervising Provider   Answer:   Elvina Sidle [[id number]]  . ranitidine (ZANTAC) 300 MG tablet    Sig: Take 1 tablet (300 mg total) by mouth at bedtime.    Dispense:  30 tablet    Refill:  2    Order Specific Question:   Supervising Provider    Answer:   Elvina Sidle [[id number]]    Reviewed expectations re: course of current medical issues. Questions answered. Outlined signs and symptoms indicating need for more acute intervention. Patient verbalized understanding. After Visit Summary given.    Procedures:  Labs Reviewed - No data to display  No results found.  No Known Allergies  PMHx, SurgHx, SocialHx, Medications, and Allergies were reviewed in the Visit Navigator and updated as appropriate.       Dorena Bodo, NP 10/23/16 [phone]

## 2016-10-23 NOTE — ED Triage Notes (Addendum)
Center chest pain, sharp per patient.  Pain has been occurring every day since onset one week ago.  Patient relates pain to reflux.  tums helps pain minimally, says zantac does not help at all Patient has ulcers.   Noticed pain in both knees.  No known injury.  Couldn't go to work today due to pain in knees

## 2018-01-02 ENCOUNTER — Emergency Department (HOSPITAL_COMMUNITY)
Admission: EM | Admit: 2018-01-02 | Discharge: 2018-01-02 | Disposition: A | Discharge status: Home / Self Care | Payer: Managed Care, Other (non HMO) | Attending: Emergency Medicine | Admitting: Emergency Medicine

## 2018-01-02 ENCOUNTER — Encounter (HOSPITAL_COMMUNITY): Payer: Self-pay | Admitting: Emergency Medicine

## 2018-01-02 ENCOUNTER — Emergency Department (HOSPITAL_COMMUNITY): Payer: Managed Care, Other (non HMO)

## 2018-01-02 ENCOUNTER — Other Ambulatory Visit: Payer: Self-pay

## 2018-01-02 DIAGNOSIS — R072 Precordial pain: Secondary | ICD-10-CM | POA: Insufficient documentation

## 2018-01-02 DIAGNOSIS — F17210 Nicotine dependence, cigarettes, uncomplicated: Secondary | ICD-10-CM | POA: Insufficient documentation

## 2018-01-02 DIAGNOSIS — Z79899 Other long term (current) drug therapy: Secondary | ICD-10-CM | POA: Insufficient documentation

## 2018-01-02 LAB — CBC
HCT: 38.8 % (ref 36.0–46.0)
HEMOGLOBIN: 12.4 g/dL (ref 12.0–15.0)
MCH: 30.5 pg (ref 26.0–34.0)
MCHC: 32 g/dL (ref 30.0–36.0)
MCV: 95.6 fL (ref 80.0–100.0)
PLATELETS: 420 10*3/uL — AB (ref 150–400)
RBC: 4.06 MIL/uL (ref 3.87–5.11)
RDW: 12 % (ref 11.5–15.5)
WBC: 7.7 10*3/uL (ref 4.0–10.5)
nRBC: 0 % (ref 0.0–0.2)

## 2018-01-02 LAB — BASIC METABOLIC PANEL
ANION GAP: 5 (ref 5–15)
BUN: 11 mg/dL (ref 6–20)
CO2: 25 mmol/L (ref 22–32)
Calcium: 9.2 mg/dL (ref 8.9–10.3)
Chloride: 108 mmol/L (ref 98–111)
Creatinine, Ser: 1.06 mg/dL — ABNORMAL HIGH (ref 0.44–1.00)
GFR calc Af Amer: >60 mL/min (ref >60)
GFR calc non Af Amer: 58 mL/min — ABNORMAL LOW (ref >60)
Glucose, Bld: 130 mg/dL — ABNORMAL HIGH (ref 70–99)
POTASSIUM: 3.5 mmol/L (ref 3.5–5.1)
SODIUM: 138 mmol/L (ref 135–145)

## 2018-01-02 LAB — I-STAT BETA HCG BLOOD, ED (MC, WL, AP ONLY): I-stat hCG, quantitative: <5 m[IU]/mL (ref <5)

## 2018-01-02 LAB — I-STAT TROPONIN, ED
TROPONIN I, POC: 0 ng/mL (ref 0.00–0.08)
TROPONIN I, POC: 0 ng/mL (ref 0.00–0.08)

## 2018-01-02 MED ORDER — PANTOPRAZOLE SODIUM 20 MG PO TBEC: 20.0000 mg | DELAYED_RELEASE_TABLET | Freq: Every day | ORAL | 1 refills | Status: DC

## 2018-01-02 MED ORDER — SUCRALFATE 1 G PO TABS: 1.0000 g | ORAL_TABLET | Freq: Three times a day (TID) | ORAL | 0 refills | Status: DC

## 2018-01-02 MED ORDER — ALUM & MAG HYDROXIDE-SIMETH 200-200-20 MG/5ML PO SUSP
30.0000 mL | Freq: Once | ORAL | Status: AC
  Administered 2018-01-02: 30 mL via ORAL
  Filled 2018-01-02: qty 30

## 2018-01-02 NOTE — Discharge Instructions (Signed)
Please read and follow all provided instructions.  Your diagnoses today include:  1. Precordial pain     Tests performed today include:  An EKG of your heart  A chest x-ray  Cardiac enzymes - a blood test for heart muscle damage  Blood counts and electrolytes  Vital signs. See below for your results today.   Medications prescribed:   Carafate - for stomach upset and to protect your stomach   Protonix -medication for reducing stomach acid  Take any prescribed medications only as directed.  Follow-up instructions: Please follow-up with your primary care provider as soon as you can for further evaluation of your symptoms.   Return instructions:  SEEK IMMEDIATE MEDICAL ATTENTION IF:  You have severe chest pain, especially if the pain is crushing or pressure-like and spreads to the arms, back, neck, or jaw, or if you have sweating, nausea (feeling sick to your stomach), or shortness of breath. THIS IS AN EMERGENCY. Don't wait to see if the pain will go away. Get medical help at once. Call 911 or 0 (operator). DO NOT drive yourself to the hospital.   Your chest pain gets worse and does not go away with rest.   You have an attack of chest pain lasting longer than usual, despite rest and treatment with the medications your caregiver has prescribed.   You wake from sleep with chest pain or shortness of breath.  You feel dizzy or faint.  You have chest pain not typical of your usual pain for which you originally saw your caregiver.   You have any other emergent concerns regarding your health.  Additional Information: Chest pain comes from many different causes. Your caregiver has diagnosed you as having chest pain that is not specific for one problem, but does not require admission.  You are at low risk for an acute heart condition or other serious illness.   Your vital signs today were: BP 122/72    Pulse 75    Temp 98.9 F (37.2 C) (Oral)    Resp (!) 23    Ht 5\' 6"  (1.676  m)    Wt 65.8 kg    LMP 08/16/2014    SpO2 100%    BMI 23.40 kg/m  If your blood pressure (BP) was elevated above 135/85 this visit, please have this repeated by your doctor within one month. --------------

## 2018-01-02 NOTE — ED Notes (Signed)
Results reviewed, no changes in acuity at this time 

## 2018-01-02 NOTE — ED Triage Notes (Signed)
Pt presents with c/o chest pain in the center of her chest also with dizziness and vomiting x2 today. Denies cardiac hx but has ulcers. No diarrhea or fevers. The patient is alert and oriented x4 with no acute distress at triage.

## 2018-01-02 NOTE — ED Provider Notes (Signed)
[facility] EMERGENCY DEPARTMENT Provider Note   CSN: [id number] Arrival date & time: 01/02/18  1003     History   Chief Complaint Chief Complaint  Patient presents with  . Chest Pain  . Emesis  . Dizziness    HPI ISMELDA WEATHERMAN is a 52 y.o. female.  Patient with history of reflux/PUD, smoking, family history of coronary artery disease and mother and older sisters --presents the emergency department with 1 week of intermittent epigastric abdominal and lower chest pain, nausea with occasional episodes of vomiting, and generalized dizziness.  Pain typically occurs when patient is at work.  Patient's chest pain does not radiate.  She does not have associated diaphoresis.  She does not get the symptoms during exertional activities.  She states that food makes her symptoms worse and she has been unable to eat as much because it causes extreme nausea.  No back pain.  No urinary symptoms.  No fever or cough.  Denies history of hypertension, high cholesterol, diabetes.  She is currently taking over-the-counter ranitidine which has not helped her symptoms.  No current PPI.  Her symptoms do feel similar to when she had ulcers in the past. Patient denies risk factors for pulmonary embolism including: unilateral leg swelling, history of DVT/PE/other blood clots, use of exogenous hormones, recent immobilizations, recent surgery, recent travel (>4hr segment), malignancy, hemoptysis.       Past Medical History:  Diagnosis Date  . Acid reflux   . Anxiety   . Anxiety     There are no active problems to display for this patient.   History reviewed. No pertinent surgical history.   OB History    Gravida  2   Para  2   Term  2   Preterm      AB      Living  2     SAB      TAB      Ectopic      Multiple      Live Births               Home Medications    Prior to Admission medications   Medication Sig Start Date End Date Taking? Authorizing  Provider  Alum & Mag Hydroxide-Simeth (GI COCKTAIL) SUSP suspension Take 30 mLs by mouth 2 (two) times daily. Shake well. 10/20/14   Emilia Beck, PA-C  omeprazole (PRILOSEC) 40 MG capsule Take 1 capsule (40 mg total) by mouth 2 (two) times daily. 10/23/16 11/06/16  Dorena Bodo, NP  ranitidine (ZANTAC) 300 MG tablet Take 1 tablet (300 mg total) by mouth at bedtime. 10/23/16   Dorena Bodo, NP  zolpidem (AMBIEN) 5 MG tablet Take 5 mg by mouth at bedtime as needed for sleep.    [provider]    Family History Family History  Problem Relation Age of Onset  . Breast cancer Sister     Social History Social History   Tobacco Use  . Smoking status: Current Every Day Smoker    Packs/day: 0.50    Types: Cigarettes  . Smokeless tobacco: Never Used  Substance Use Topics  . Alcohol use: Yes    Alcohol/week: 2.0 standard drinks    Types: 2 Cans of beer per week  . Drug use: No     Allergies   Patient has no known allergies.   Review of Systems Review of Systems  Constitutional: Negative for diaphoresis and fever.  Eyes: Negative for redness.  Respiratory: Positive for  shortness of breath. Negative for cough.   Cardiovascular: Positive for chest pain. Negative for palpitations and leg swelling.  Gastrointestinal: Positive for nausea and vomiting. Negative for abdominal pain.  Genitourinary: Negative for dysuria.  Musculoskeletal: Negative for back pain and neck pain.  Skin: Negative for rash.  Neurological: Positive for dizziness. Negative for syncope and light-headedness.  Psychiatric/Behavioral: The patient is not nervous/anxious.      Physical Exam Updated Vital Signs BP 113/70   Pulse 61   Temp 98.9 F (37.2 C) (Oral)   Resp 16   Ht 5\' 6"  (1.676 m)   Wt 65.8 kg   LMP 08/16/2014   SpO2 99%   BMI 23.40 kg/m   Physical Exam  Constitutional: She appears well-developed and well-nourished.  HENT:  Head: Normocephalic and atraumatic.    Mouth/Throat: Mucous membranes are normal. Mucous membranes are not dry.  Eyes: Conjunctivae are normal.  Neck: Trachea normal and normal range of motion. Neck supple. Normal carotid pulses and no JVD present. No muscular tenderness present. Carotid bruit is not present. No tracheal deviation present.  Cardiovascular: Normal rate, regular rhythm, S1 normal, S2 normal, normal heart sounds and intact distal pulses. Exam reveals no decreased pulses.  No murmur heard. Pulmonary/Chest: Effort normal. No respiratory distress. She has no wheezes. She exhibits no tenderness.  Abdominal: Soft. Normal aorta and bowel sounds are normal. There is no tenderness. There is no rebound and no guarding.  Musculoskeletal: Normal range of motion.  Neurological: She is alert.  Skin: Skin is warm and dry. She is not diaphoretic. No cyanosis. No pallor.  Psychiatric: She has a normal mood and affect.  Nursing note and vitals reviewed.    ED Treatments / Results  Labs (all labs ordered are listed, but only abnormal results are displayed) Labs Reviewed  BASIC METABOLIC PANEL - Abnormal; Notable for the following components:      Result Value   Glucose, Bld 130 (*)    Creatinine, Ser 1.06 (*)    GFR calc non Af Amer 58 (*)    All other components within normal limits  CBC - Abnormal; Notable for the following components:   Platelets 420 (*)    All other components within normal limits  I-STAT TROPONIN, ED  I-STAT BETA HCG BLOOD, ED ([facility], [facility], [facility] ONLY)  I-STAT TROPONIN, ED    EKG EKG Interpretation  Date/Time:  Tuesday January 02 2018 10:05:18 EDT Ventricular Rate:  99 PR Interval:  130 QRS Duration: 72 QT Interval:  372 QTC Calculation: 477 R Axis:   50 Text Interpretation:  Normal sinus rhythm Normal ECG Confirmed by Virgina Norfolk [phone]) on 01/02/2018 4:16:22 PM   Radiology Dg Chest 2 View  Result Date: 01/02/2018 CLINICAL DATA:  Mid chest pain associated with dizziness and vomiting  beginning today. History of peptic ulcer disease. Current smoker. EXAM: CHEST - 2 VIEW COMPARISON:  PA and lateral chest x-ray of October 20, 2014 FINDINGS: The lungs are mildly hyperinflated. The interstitial markings are coarse though stable. There is no alveolar infiltrate or pleural effusion. The heart and pulmonary vascularity are normal. The trachea is midline. There is calcification in the wall of the aortic arch. The bony thorax is unremarkable. IMPRESSION: Hyperinflation consistent with COPD or reactive airway disease. One cannot exclude superimposed acute bronchitis. No CHF. Thoracic aortic atherosclerosis. Electronically Signed   By: David  [place] M.D.   On: 01/02/2018 10:46    Procedures Procedures (including critical care time)  Medications Ordered in ED Medications -  No data to display   Initial Impression / Assessment and Plan / ED Course  I have reviewed the triage vital signs and the nursing notes.  Pertinent labs & imaging results that were available during my care of the patient were reviewed by me and considered in my medical decision making (see chart for details).     Patient seen and examined. Work-up reviewed.  EKG reviewed by myself.  Initial cardiac work-up is negative.  Patient has been waiting in the emergency department for greater than 6 hours.  Repeat troponin and EKG ordered.  Initial impression is that patient is most likely GI in etiology.  Patient will need to be placed on a PPI.  However, she does have some risk factors including smoking, strong family history of coronary artery disease, and the fact that she is now postmenopausal.  She would likely benefit from cardiology eval given these risk factors, however I do not strongly feel that she needs to be admitted for this.  Discussed this with patient and she is in agreement.  Pending results of troponin.  Vital signs reviewed and are as follows: BP 113/70   Pulse 61   Temp 98.9 F (37.2 C) (Oral)   Resp 16    Ht 5\' 6"  (1.676 m)   Wt 65.8 kg   LMP 08/16/2014   SpO2 99%   BMI 23.40 kg/m   6:05 PM repeat EKG and second troponin are both normal.  Patient will be discharged home at this time with plan as above.  Discussed with patient that she should return to the emergency department with persistent chest pain, worsening or changing chest pain, new symptoms or other concerns.  She verbalizes understanding agrees with plan.  ED ECG REPORT   Date: 01/02/2018  Rate: 70  Rhythm: normal sinus rhythm  QRS Axis: normal  Intervals: normal  ST/T Wave abnormalities: normal  Conduction Disutrbances:none  Narrative Interpretation:   Old EKG Reviewed: unchanged  I have personally reviewed the EKG tracing and agree with the computerized printout as noted.   Final Clinical Impressions(s) / ED Diagnoses   Final diagnoses:  Precordial pain   Patient with history of peptic ulcer disease/gastritis presents with intermittent chest pains.  Cardiac work-up is reassuring.  Troponin negative x2.  EKG nonischemic and unchanged x2.  Chest x-ray is clear without signs of infection or enlarged heart.  Patient does not have signs or symptoms of pulmonary embolism or clinical signs or symptoms of DVT today.  Low suspicion for dissection or aneurysm based on patient presentation and complaint.  Suspect that patient's symptoms are most likely GI in etiology.  Home with Protonix and Carafate to treat for this.  Given her risk factors, encourage cardiology follow-up.  ED Discharge Orders         Ordered    pantoprazole (PROTONIX) 20 MG tablet  Daily     01/02/18 1752    sucralfate (CARAFATE) 1 g tablet  3 times daily with meals & bedtime     01/02/18 1753           Renne Crigler, PA-C 01/02/18 1807    Curatolo, Adam, DO 01/03/18 8295

## 2018-03-06 ENCOUNTER — Emergency Department (HOSPITAL_COMMUNITY)
Admission: EM | Admit: 2018-03-06 | Discharge: 2018-03-06 | Disposition: A | Discharge status: Home / Self Care | Payer: Self-pay | Attending: Emergency Medicine | Admitting: Emergency Medicine

## 2018-03-06 ENCOUNTER — Other Ambulatory Visit: Payer: Self-pay

## 2018-03-06 ENCOUNTER — Encounter (HOSPITAL_COMMUNITY): Payer: Self-pay | Admitting: Emergency Medicine

## 2018-03-06 ENCOUNTER — Emergency Department (HOSPITAL_COMMUNITY): Payer: Self-pay

## 2018-03-06 DIAGNOSIS — R1013 Epigastric pain: Secondary | ICD-10-CM | POA: Insufficient documentation

## 2018-03-06 DIAGNOSIS — R1011 Right upper quadrant pain: Secondary | ICD-10-CM | POA: Insufficient documentation

## 2018-03-06 DIAGNOSIS — R109 Unspecified abdominal pain: Secondary | ICD-10-CM

## 2018-03-06 DIAGNOSIS — F17210 Nicotine dependence, cigarettes, uncomplicated: Secondary | ICD-10-CM | POA: Insufficient documentation

## 2018-03-06 DIAGNOSIS — Z79899 Other long term (current) drug therapy: Secondary | ICD-10-CM | POA: Insufficient documentation

## 2018-03-06 LAB — URINALYSIS, ROUTINE W REFLEX MICROSCOPIC
BACTERIA UA: NONE SEEN
Bilirubin Urine: NEGATIVE
GLUCOSE, UA: NEGATIVE mg/dL
KETONES UR: NEGATIVE mg/dL
Leukocytes, UA: NEGATIVE
Nitrite: NEGATIVE
PROTEIN: NEGATIVE mg/dL
Specific Gravity, Urine: 1.013 (ref 1.005–1.030)
pH: 6 (ref 5.0–8.0)

## 2018-03-06 LAB — COMPREHENSIVE METABOLIC PANEL
ALT: 12 U/L (ref 0–44)
ANION GAP: 8 (ref 5–15)
AST: 17 U/L (ref 15–41)
Albumin: 3.4 g/dL — ABNORMAL LOW (ref 3.5–5.0)
Alkaline Phosphatase: 62 U/L (ref 38–126)
BILIRUBIN TOTAL: 0.3 mg/dL (ref 0.3–1.2)
BUN: 11 mg/dL (ref 6–20)
CO2: 23 mmol/L (ref 22–32)
Calcium: 9 mg/dL (ref 8.9–10.3)
Chloride: 107 mmol/L (ref 98–111)
Creatinine, Ser: 0.94 mg/dL (ref 0.44–1.00)
GFR calc Af Amer: >60 mL/min (ref >60)
GFR calc non Af Amer: >60 mL/min (ref >60)
GLUCOSE: 89 mg/dL (ref 70–99)
POTASSIUM: 3.6 mmol/L (ref 3.5–5.1)
Sodium: 138 mmol/L (ref 135–145)
TOTAL PROTEIN: 6.7 g/dL (ref 6.5–8.1)

## 2018-03-06 LAB — CBC
HCT: 32.6 % — ABNORMAL LOW (ref 36.0–46.0)
HEMOGLOBIN: 10.2 g/dL — AB (ref 12.0–15.0)
MCH: 30.7 pg (ref 26.0–34.0)
MCHC: 31.3 g/dL (ref 30.0–36.0)
MCV: 98.2 fL (ref 80.0–100.0)
Platelets: 385 10*3/uL (ref 150–400)
RBC: 3.32 MIL/uL — AB (ref 3.87–5.11)
RDW: 13.4 % (ref 11.5–15.5)
WBC: 6.7 10*3/uL (ref 4.0–10.5)
nRBC: 0 % (ref 0.0–0.2)

## 2018-03-06 LAB — LIPASE, BLOOD: Lipase: 20 U/L (ref 11–51)

## 2018-03-06 LAB — I-STAT TROPONIN, ED: TROPONIN I, POC: 0 ng/mL (ref 0.00–0.08)

## 2018-03-06 LAB — I-STAT BETA HCG BLOOD, ED (MC, WL, AP ONLY): I-stat hCG, quantitative: <5 m[IU]/mL (ref <5)

## 2018-03-06 MED ORDER — ALUM & MAG HYDROXIDE-SIMETH 200-200-20 MG/5ML PO SUSP
30.0000 mL | Freq: Once | ORAL | Status: AC
  Administered 2018-03-06: 30 mL via ORAL
  Filled 2018-03-06: qty 30

## 2018-03-06 MED ORDER — FAMOTIDINE 20 MG PO TABS
20.0000 mg | ORAL_TABLET | Freq: Once | ORAL | Status: AC
  Administered 2018-03-06: 20 mg via ORAL
  Filled 2018-03-06: qty 1

## 2018-03-06 MED ORDER — ONDANSETRON 4 MG PO TBDP
4.0000 mg | ORAL_TABLET | Freq: Once | ORAL | Status: AC
  Administered 2018-03-06: 4 mg via ORAL
  Filled 2018-03-06: qty 1

## 2018-03-06 MED ORDER — PANTOPRAZOLE SODIUM 20 MG PO TBEC: 20.0000 mg | DELAYED_RELEASE_TABLET | Freq: Every day | ORAL | 0 refills | Status: DC

## 2018-03-06 MED ORDER — SUCRALFATE 1 G PO TABS: 1.0000 g | ORAL_TABLET | Freq: Three times a day (TID) | ORAL | 0 refills | Status: DC

## 2018-03-06 MED ORDER — SUCRALFATE 1 G PO TABS
1.0000 g | ORAL_TABLET | Freq: Once | ORAL | Status: AC
  Administered 2018-03-06: 1 g via ORAL
  Filled 2018-03-06: qty 1

## 2018-03-06 NOTE — ED Notes (Signed)
Patient transported to X-ray 

## 2018-03-06 NOTE — ED Triage Notes (Signed)
Pt with c/o abdominal pain today. She reports hx of gastric ulcers.

## 2018-03-06 NOTE — ED Provider Notes (Signed)
[facility] EMERGENCY DEPARTMENT Provider Note   CSN: [id number] Arrival date & time: 03/06/18  4536     History   Chief Complaint Chief Complaint  Patient presents with  . Abdominal Pain    HPI Anita Obrien is a 52 y.o. female.  HPI   Pt is a 52 y/o female with a h/o acid reflux who presents to the ED for evaluation of abd pain that has been present for the last 2 weeks but seemed worse today. Pain is rated at 8/10. Pain feels like a needle stick. Pain is intermittent. Current episode has lasted an hour. She also reports nausea, vomiting x2. Denies diarrhea or constipation. She she has chest pain as well. States pain feels exactly like her ulcers. Denies sob. Pt denies dysuria, frequency, urgency.   Pt states she has a h/o gastric ulcers. States she has never had and EGD. She states she takes zantac daily.   States she drinks one 40 oz beer daily. 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  Past Medical History:  Diagnosis Date  . Acid reflux   . Anxiety   . Anxiety     There are no active problems to display for this patient.   History reviewed. No pertinent surgical history.   OB History    Gravida  2   Para  2   Term  2   Preterm      AB      Living  2     SAB      TAB      Ectopic      Multiple      Live Births               Home Medications    Prior to Admission medications   Medication Sig Start Date End Date Taking? Authorizing Provider  raNITIdine HCl (ZANTAC PO) Take 1 tablet by mouth as needed.   Yes [provider]  pantoprazole (PROTONIX) 20 MG tablet Take 1 tablet (20 mg total) by mouth daily for 14 days. 03/06/18 03/20/18  Jabar Krysiak S, PA-C  sucralfate (CARAFATE) 1 g tablet Take 1 tablet (1 g total) by mouth 4 (four) times daily -  with meals and at bedtime for 14 days. 03/06/18 03/20/18  Lynnley Doddridge S, PA-C    Family History Family History  Problem Relation Age of Onset    . Breast cancer Sister     Social History Social History   Tobacco Use  . Smoking status: Current Every Day Smoker    Packs/day: 0.50    Types: Cigarettes  . Smokeless tobacco: Never Used  Substance Use Topics  . Alcohol use: Yes    Alcohol/week: 2.0 standard drinks    Types: 2 Cans of beer per week  . Drug use: No     Allergies   Patient has no known allergies.   Review of Systems Review of Systems  Constitutional: Negative for chills.  HENT: Negative for congestion.   Eyes: Negative for visual disturbance.  Respiratory: Negative for shortness of breath.   Cardiovascular: Positive for chest pain.  Gastrointestinal: Positive for abdominal pain, nausea and vomiting. Negative for constipation and diarrhea.  Genitourinary: Negative for dysuria.  Musculoskeletal: Negative for back pain.  Skin: Negative for rash.  Neurological: Negative for headaches.    Physical Exam Updated Vital Signs BP (!) 142/73 (BP Location: Right Arm)   Pulse 84   Temp 98.4 F (  36.9 C) (Oral)   Resp 16   Ht 5' 6"  (1.676 m)   Wt 65.3 kg   LMP 08/16/2014   SpO2 100%   BMI 23.24 kg/m   Physical Exam Vitals signs and nursing note reviewed.  Constitutional:      General: She is not in acute distress.    Appearance: She is well-developed. She is not ill-appearing or toxic-appearing.  HENT:     Head: Normocephalic and atraumatic.  Eyes:     Conjunctiva/sclera: Conjunctivae normal.  Neck:     Musculoskeletal: Neck supple.  Cardiovascular:     Rate and Rhythm: Normal rate and regular rhythm.     Heart sounds: Normal heart sounds. No murmur.  Pulmonary:     Effort: Pulmonary effort is normal. No respiratory distress.     Breath sounds: Normal breath sounds. No stridor. No wheezing or rhonchi.  Abdominal:     General: Bowel sounds are normal.     Palpations: Abdomen is soft.     Tenderness: There is abdominal tenderness in the right upper quadrant and epigastric area. There is left CVA  tenderness. There is no right CVA tenderness, guarding or rebound.  Skin:    General: Skin is warm and dry.  Neurological:     Mental Status: She is alert.  Psychiatric:        Mood and Affect: Mood normal.      ED Treatments / Results  Labs (all labs ordered are listed, but only abnormal results are displayed) Labs Reviewed  COMPREHENSIVE METABOLIC PANEL - Abnormal; Notable for the following components:      Result Value   Albumin 3.4 (*)    All other components within normal limits  CBC - Abnormal; Notable for the following components:   RBC 3.32 (*)    Hemoglobin 10.2 (*)    HCT 32.6 (*)    All other components within normal limits  URINALYSIS, ROUTINE W REFLEX MICROSCOPIC - Abnormal; Notable for the following components:   Hgb urine dipstick SMALL (*)    All other components within normal limits  LIPASE, BLOOD  I-STAT BETA HCG BLOOD, ED ([facility], [facility], [facility] ONLY)  I-STAT TROPONIN, ED    EKG EKG Interpretation  Date/Time:  Tuesday March 06 2018 12:19:24 EST Ventricular Rate:  72 PR Interval:  144 QRS Duration: 64 QT Interval:  442 QTC Calculation: 483 R Axis:   55 Text Interpretation:  Normal sinus rhythm Prolonged QT Abnormal ECG Confirmed by Malvin Johns [phone]) on 03/06/2018 12:32:24 PM   Radiology Dg Chest 2 View  Result Date: 03/06/2018 CLINICAL DATA:  Epigastric pain, chest pain EXAM: CHEST - 2 VIEW COMPARISON:  01/02/2018 FINDINGS: Heart and mediastinal contours are within normal limits. No focal opacities or effusions. No acute bony abnormality. Mild hyperinflation IMPRESSION: Hyperinflation.  No active disease. Electronically Signed   By: Rolm Baptise M.D.   On: 03/06/2018 11:37    Procedures Procedures (including critical care time)  Medications Ordered in ED Medications  alum & mag hydroxide-simeth (MAALOX/MYLANTA) 200-200-20 MG/5ML suspension 30 mL (30 mLs Oral Given 03/06/18 1142)  sucralfate (CARAFATE) tablet 1 g (1 g Oral Given 03/06/18 1142)    famotidine (PEPCID) tablet 20 mg (20 mg Oral Given 03/06/18 1142)  ondansetron (ZOFRAN-ODT) disintegrating tablet 4 mg (4 mg Oral Given 03/06/18 1142)     Initial Impression / Assessment and Plan / ED Course  I have reviewed the triage vital signs and the nursing notes.  Pertinent labs & imaging results  that were available during my care of the patient were reviewed by me and considered in my medical decision making (see chart for details).   Final Clinical Impressions(s) / ED Diagnoses   Final diagnoses:  Abdominal pain, unspecified abdominal location   Pt presenting with RUE and epigastric abd pain that has been on going intermittently but seems to be worse recently. Afebrile, normal vitals.  Epigastric and RUQ abd tenderness on exam.   CBC no leukocytosis.  Anemia is present, patient states she has a history of this and is not currently taking her iron supplementation.  She states she can buy some over-the-counter. CMP with normal electrolytes.  Normal kidney and liver function.  Alk phos and T bili are normal. Lipase normal Trop negative UA with hematuria but no other abnormalities. CXR without evidence of pneumonia or other abnormality. EKG with normal sinus rhythm, without ischemic changes  On reevaluation, patient states she feels much improved after medications.  She has been able to tolerate p.o.  Repeat abdominal exam with mild epigastric tenderness with no rigidity rebound or guarding.  Discussed findings with patient and plan for discharge with symptomatic management.  We will also give referral to GI for her to follow-up with.  Advised to return to the ER for new or worsening symptoms in the meantime.  She voiced understanding the plan reasons return.  All questions answered  ED Discharge Orders         Ordered    pantoprazole (PROTONIX) 20 MG tablet  Daily     03/06/18 1330    sucralfate (CARAFATE) 1 g tablet  3 times daily with meals & bedtime     03/06/18 1330            Rodney Booze, PA-C 03/06/18 1331    Margette Fast, MD 03/06/18 [phone]

## 2018-03-06 NOTE — Discharge Instructions (Addendum)
Take medications as prescribed.  Please follow up with your primary doctor within the next 5-7 days.  Please return to the ER sooner if you have any new or worsening symptoms, or if you have any of the following symptoms:  Abdominal pain that does not go away.  You have a fever.  You keep throwing up (vomiting).  The pain is felt only in portions of the abdomen. Pain in the right side could possibly be appendicitis. In an adult, pain in the left lower portion of the abdomen could be colitis or diverticulitis.  You pass bloody or black tarry stools.  There is bright red blood in the stool.  The constipation stays for more than 4 days.  There is belly (abdominal) or rectal pain.  You do not seem to be getting better.  You have any questions or concerns.

## 2018-03-06 NOTE — ED Notes (Signed)
Discharge instructions and prescriptions discussed with Pt. Pt verbalized understanding. Pt stable and ambulatory.   

## 2018-10-09 ENCOUNTER — Other Ambulatory Visit: Payer: Self-pay

## 2018-10-09 ENCOUNTER — Encounter (HOSPITAL_COMMUNITY): Payer: Self-pay | Admitting: Emergency Medicine

## 2018-10-09 ENCOUNTER — Emergency Department (HOSPITAL_COMMUNITY)
Admission: EM | Admit: 2018-10-09 | Discharge: 2018-10-09 | Disposition: A | Discharge status: Home / Self Care | Payer: Self-pay | Attending: Emergency Medicine | Admitting: Emergency Medicine

## 2018-10-09 ENCOUNTER — Emergency Department (HOSPITAL_COMMUNITY): Payer: Self-pay

## 2018-10-09 DIAGNOSIS — Z79899 Other long term (current) drug therapy: Secondary | ICD-10-CM | POA: Insufficient documentation

## 2018-10-09 DIAGNOSIS — R1013 Epigastric pain: Secondary | ICD-10-CM | POA: Insufficient documentation

## 2018-10-09 DIAGNOSIS — F17210 Nicotine dependence, cigarettes, uncomplicated: Secondary | ICD-10-CM | POA: Insufficient documentation

## 2018-10-09 DIAGNOSIS — R112 Nausea with vomiting, unspecified: Secondary | ICD-10-CM | POA: Insufficient documentation

## 2018-10-09 LAB — CBC
HCT: 38.6 % (ref 36.0–46.0)
Hemoglobin: 12.7 g/dL (ref 12.0–15.0)
MCH: 32 pg (ref 26.0–34.0)
MCHC: 32.9 g/dL (ref 30.0–36.0)
MCV: 97.2 fL (ref 80.0–100.0)
Platelets: 368 10*3/uL (ref 150–400)
RBC: 3.97 MIL/uL (ref 3.87–5.11)
RDW: 12.2 % (ref 11.5–15.5)
WBC: 5.5 10*3/uL (ref 4.0–10.5)
nRBC: 0 % (ref 0.0–0.2)

## 2018-10-09 LAB — COMPREHENSIVE METABOLIC PANEL
ALT: 11 U/L (ref 0–44)
AST: 19 U/L (ref 15–41)
Albumin: 3.9 g/dL (ref 3.5–5.0)
Alkaline Phosphatase: 63 U/L (ref 38–126)
Anion gap: 8 (ref 5–15)
BUN: 11 mg/dL (ref 6–20)
CO2: 25 mmol/L (ref 22–32)
Calcium: 9.5 mg/dL (ref 8.9–10.3)
Chloride: 103 mmol/L (ref 98–111)
Creatinine, Ser: 1.08 mg/dL — ABNORMAL HIGH (ref 0.44–1.00)
GFR calc Af Amer: >60 mL/min (ref >60)
GFR calc non Af Amer: 58 mL/min — ABNORMAL LOW (ref >60)
Glucose, Bld: 95 mg/dL (ref 70–99)
Potassium: 3.7 mmol/L (ref 3.5–5.1)
Sodium: 136 mmol/L (ref 135–145)
Total Bilirubin: 0.4 mg/dL (ref 0.3–1.2)
Total Protein: 7.4 g/dL (ref 6.5–8.1)

## 2018-10-09 LAB — I-STAT BETA HCG BLOOD, ED (MC, WL, AP ONLY): I-stat hCG, quantitative: <5 m[IU]/mL (ref <5)

## 2018-10-09 LAB — URINALYSIS, ROUTINE W REFLEX MICROSCOPIC
Bilirubin Urine: NEGATIVE
Glucose, UA: NEGATIVE mg/dL
Hgb urine dipstick: NEGATIVE
Ketones, ur: NEGATIVE mg/dL
Leukocytes,Ua: NEGATIVE
Nitrite: NEGATIVE
Protein, ur: NEGATIVE mg/dL
Specific Gravity, Urine: 1.017 (ref 1.005–1.030)
pH: 8 (ref 5.0–8.0)

## 2018-10-09 LAB — LIPASE, BLOOD: Lipase: 28 U/L (ref 11–51)

## 2018-10-09 MED ORDER — SODIUM CHLORIDE 0.9% FLUSH: 3.0000 mL | Freq: Once | INTRAVENOUS | Status: DC

## 2018-10-09 MED ORDER — OMEPRAZOLE 20 MG PO CPDR: 20.0000 mg | DELAYED_RELEASE_CAPSULE | Freq: Every day | ORAL | 0 refills | Status: DC

## 2018-10-09 NOTE — ED Provider Notes (Signed)
[facility] Provider Note   CSN: [id number] Arrival date & time: 10/09/18  1117    History   Chief Complaint Chief Complaint  Patient presents with  . Abdominal Pain    epigastric    HPI Anita Obrien is a 53 y.o. female.     Pt states it has been going on for months.   It occurs daily.  She has tried baking powder and it seems to help.  The history is provided by the patient.  Abdominal Pain Pain location:  Epigastric Pain quality: sharp   Pain radiates to:  Chest Duration: several months. Timing:  Constant Relieved by: baking powder. Exacerbated by: certain foods. Associated symptoms: diarrhea, nausea and vomiting   Associated symptoms: no cough, no dysuria and no fever     Past Medical History:  Diagnosis Date  . Acid reflux   . Anxiety   . Anxiety     There are no active problems to display for this patient.   History reviewed. No pertinent surgical history.   OB History    Gravida  2   Para  2   Term  2   Preterm      AB      Living  2     SAB      TAB      Ectopic      Multiple      Live Births               Home Medications    Prior to Admission medications   Medication Sig Start Date End Date Taking? Authorizing Provider  omeprazole (PRILOSEC) 20 MG capsule Take 1 capsule (20 mg total) by mouth daily. 10/09/18   Linwood DibblesKnapp, Deb Loudin, MD  pantoprazole (PROTONIX) 20 MG tablet Take 1 tablet (20 mg total) by mouth daily for 14 days. 03/06/18 03/20/18  Couture, Cortni S, PA-C  raNITIdine HCl (ZANTAC PO) Take 1 tablet by mouth as needed.    [provider]  sucralfate (CARAFATE) 1 g tablet Take 1 tablet (1 g total) by mouth 4 (four) times daily -  with meals and at bedtime for 14 days. 03/06/18 03/20/18  Couture, Cortni S, PA-C    Family History Family History  Problem Relation Age of Onset  . Breast cancer Sister     Social History Social History   Tobacco Use  . Smoking status:  Current Every Day Smoker    Packs/day: 0.50    Types: Cigarettes  . Smokeless tobacco: Never Used  Substance Use Topics  . Alcohol use: Yes    Alcohol/week: 2.0 standard drinks    Types: 2 Cans of beer per week  . Drug use: No     Allergies   Patient has no known allergies.   Review of Systems Review of Systems  Constitutional: Negative for fever.  Respiratory: Negative for cough.   Gastrointestinal: Positive for abdominal pain, diarrhea, nausea and vomiting.  Genitourinary: Negative for dysuria.  All other systems reviewed and are negative.    Physical Exam Updated Vital Signs BP 116/78 (BP Location: Right Arm)   Pulse 61   Temp 98.6 F (37 C) (Oral)   Resp 16   Ht 1.676 m (5\' 6" )   Wt 68 kg   LMP 08/16/2014 (Exact Date)   SpO2 100%   BMI 24.21 kg/m   Physical Exam Vitals signs and nursing note reviewed.  Constitutional:      General: She is not  in acute distress.    Appearance: She is well-developed.  HENT:     Head: Normocephalic and atraumatic.     Right Ear: External ear normal.     Left Ear: External ear normal.  Eyes:     General: No scleral icterus.       Right eye: No discharge.        Left eye: No discharge.     Conjunctiva/sclera: Conjunctivae normal.  Neck:     Musculoskeletal: Neck supple.     Trachea: No tracheal deviation.  Cardiovascular:     Rate and Rhythm: Normal rate and regular rhythm.  Pulmonary:     Effort: Pulmonary effort is normal. No respiratory distress.     Breath sounds: Normal breath sounds. No stridor. No wheezing or rales.  Abdominal:     General: Bowel sounds are normal. There is no distension.     Palpations: Abdomen is soft.     Tenderness: There is abdominal tenderness in the epigastric area. There is no guarding or rebound.  Musculoskeletal:        General: No tenderness.  Skin:    General: Skin is warm and dry.     Findings: No rash.  Neurological:     Mental Status: She is alert.     Cranial Nerves: No  cranial nerve deficit (no facial droop, extraocular movements intact, no slurred speech).     Sensory: No sensory deficit.     Motor: No abnormal muscle tone or seizure activity.     Coordination: Coordination normal.      ED Treatments / Results  Labs (all labs ordered are listed, but only abnormal results are displayed) Labs Reviewed  COMPREHENSIVE METABOLIC PANEL - Abnormal; Notable for the following components:      Result Value   Creatinine, Ser 1.08 (*)    GFR calc non Af Amer 58 (*)    All other components within normal limits  LIPASE, BLOOD  CBC  URINALYSIS, ROUTINE W REFLEX MICROSCOPIC  I-STAT BETA HCG BLOOD, ED ([facility], [facility], [facility] ONLY)    EKG None  Radiology US Abdomen Complete  Result Date: 10/09/2018 CLINICAL DATA:  Upper abdominal pain 3 months. EXAM: ABDOMEN ULTRASOUND COMPLETE COMPARISON:  None. FINDINGS: Gallbladder: No gallstones or wall thickening visualized. No sonographic Murphy sign noted by sonographer. Common bile duct: Diameter: 2.9 mm Liver: No focal lesion identified. Within normal limits in parenchymal echogenicity. Portal vein is patent on color Doppler imaging with normal direction of blood flow towards the liver. IVC: No abnormality visualized. Pancreas: Heterogeneous echogenicity of the pancreas. Spleen: Size and appearance within normal limits. Right Kidney: Length: 9.8 cm. Echogenicity within normal limits. No mass or hydronephrosis visualized. Left Kidney: Length: 9.3 cm. Echogenicity within normal limits. No mass or hydronephrosis visualized. Abdominal aorta: No aneurysm visualized. Other findings: None. IMPRESSION: Heterogeneous appearance of the pancreas, which may be seen with chronic pancreatitis. Please correlate to pancreatic enzyme values. Otherwise normal abdominal ultrasound. Electronically Signed   By: Fidela Salisbury M.D.   On: 10/09/2018 16:46    Procedures Procedures (including critical care time)  Medications Ordered in ED Medications   sodium chloride flush (NS) 0.9 % injection 3 mL (has no administration in time range)     Initial Impression / Assessment and Plan / ED Course  I have reviewed the triage vital signs and the nursing notes.  Pertinent labs & imaging results that were available during my care of the patient were reviewed by me and considered  in my medical decision making (see chart for details).  Clinical Course as of Oct 09 1850  Tue Oct 09, 2018  1543 Labs reviewed.  No significant abnormalities.   [JK]    Clinical Course User Index [JK] Linwood DibblesKnapp, Jsoeph Podesta, MD     Patient presented with persistent abdominal pain.  ED labs are reassuring.  Ultrasound was performed considering that chronicity of her symptoms and any pain in the upper abdomen.  Ultrasound does not show any acute abnormalities.  I do not think she has any issues with her pancreas.  Patient denies history of previous pancreatitis.  Symptoms may be related to gastritis or peptic ulcer disease.  Will start on antacid and recommend outpatient GI follow-up. Final Clinical Impressions(s) / ED Diagnoses   Final diagnoses:  Epigastric pain    ED Discharge Orders         Ordered    omeprazole (PRILOSEC) 20 MG capsule  Daily     10/09/18 1850           Linwood DibblesKnapp, Jamier Urbas, MD 10/09/18 [phone]

## 2018-10-09 NOTE — ED Triage Notes (Signed)
Pt reports mid abdominal pain that has been going on for "a while". Pt reports a hx of ulcers and believes she might have one.

## 2018-10-09 NOTE — Discharge Instructions (Signed)
Consider seeing a GI doctor as we discussed for further evaluation.  Return as needed for worsening symptoms

## 2018-10-09 NOTE — ED Notes (Signed)
Patient verbalizes understanding of discharge instructions. Opportunity for questioning and answers were provided. Pt discharged from ED. 

## 2019-01-17 ENCOUNTER — Ambulatory Visit: Payer: Self-pay | Admitting: Family Medicine

## 2019-01-17 ENCOUNTER — Other Ambulatory Visit: Payer: Self-pay | Admitting: Family Medicine

## 2019-01-17 ENCOUNTER — Encounter: Payer: Self-pay | Admitting: Family Medicine

## 2019-01-17 VITALS — BP 102/68 | HR 74 | Resp 18

## 2019-01-17 DIAGNOSIS — R1013 Epigastric pain: Secondary | ICD-10-CM

## 2019-01-17 MED ORDER — PANTOPRAZOLE SODIUM 40 MG PO TBEC: 40.0000 mg | DELAYED_RELEASE_TABLET | Freq: Every day | ORAL | 0 refills | Status: DC

## 2019-01-17 NOTE — Patient Instructions (Signed)
Anita Obrien, please call and get an appointment set up with a primary care doctor. You need to be seen by a gastroenterologist or stomach doctor and they can help set that up for you. Go to the emergency room if your stomach pain worsens or you have significant bleeding or chest pain. I will call you with your lab results.    Abdominal Pain, Adult  Many things can cause belly (abdominal) pain. Most times, belly pain is not dangerous. Many cases of belly pain can be watched and treated at home. Sometimes belly pain is serious, though. Your doctor will try to find the cause of your belly pain. Follow these instructions at home:  Take over-the-counter and prescription medicines only as told by your doctor. Do not take medicines that help you poop (laxatives) unless told to by your doctor.  Drink enough fluid to keep your pee (urine) clear or pale yellow.  Watch your belly pain for any changes.  Keep all follow-up visits as told by your doctor. This is important. Contact a doctor if:  Your belly pain changes or gets worse.  You are not hungry, or you lose weight without trying.  You are having trouble pooping (constipated) or have watery poop (diarrhea) for more than 2-3 days.  You have pain when you pee or poop.  Your belly pain wakes you up at night.  Your pain gets worse with meals, after eating, or with certain foods.  You are throwing up and cannot keep anything down.  You have a fever. Get help right away if:  Your pain does not go away as soon as your doctor says it should.  You cannot stop throwing up.  Your pain is only in areas of your belly, such as the right side or the left lower part of the belly.  You have bloody or black poop, or poop that looks like tar.  You have very bad pain, cramping, or bloating in your belly.  You have signs of not having enough fluid or water in your body (dehydration), such as: ? Dark pee, very little pee, or no pee. ? Cracked lips. ?  Dry mouth. ? Sunken eyes. ? Sleepiness. ? Weakness. This information is not intended to replace advice given to you by your health care provider. Make sure you discuss any questions you have with your health care provider. Document Released: 08/10/2007 Document Revised: 09/11/2015 Document Reviewed: 08/05/2015 Elsevier Interactive Patient Education  [employer].

## 2019-01-17 NOTE — Progress Notes (Signed)
Subjective:     Patient ID: Anita Obrien, female   DOB: September 17, 1965, 53 y.o.   MRN: [id number]  HPI Anita Obrien presents to the [facility] clinic today with complaints of epigastric abdominal pain that has been ongoing for months. She would like help with finding a PCP as well. She reports epigastric pain is intermittent, described as an ache that at times radiates into her right sided chest, worse after eating. States at times feels short of breath with this, but states this is not exacerbated by exertion. She reports often accompanied by N/V. Denies diarrhea. Reports at times notices light red color to her stool, stool is at times a dark color as well. She reports also notices small amount of BRB in her vomitus. Notices this blood when she firsts vomits. She states milk, baking soda, and pepto have helped her symptoms. She was seen in the ED for this same problem in August 2020. Ultrasound and lab work were normal with no evidence of an acute problem, they had referred her to GI, but she was unable to go due to not having insurance. She reports she has insurance now and wants to get this looked at further. She has never had a colonoscopy or endoscopy. She reports she has lost weight without trying but unsure of the amount. She is a current smoker, but would like to quit. She does not have a plan for quitting. She denies alcohol or drug use.   Review of Systems  Constitutional: Positive for unexpected weight change. Negative for chills and fever.  Respiratory: Negative for choking and chest tightness.   Cardiovascular: Negative for chest pain, palpitations and leg swelling.  Gastrointestinal: Positive for abdominal pain, blood in stool, nausea and vomiting. Negative for constipation and diarrhea.  Genitourinary: Negative for difficulty urinating, dysuria, hematuria, vaginal bleeding and vaginal discharge.  Musculoskeletal: Negative for back pain.  Skin: Negative for color change.  Neurological: Negative for  dizziness, weakness and headaches.       Objective:   Physical Exam Vitals signs reviewed.  Constitutional:      General: She is not in acute distress.    Appearance: Normal appearance. She is not toxic-appearing.  HENT:     Head: Normocephalic and atraumatic.  Eyes:     Conjunctiva/sclera: Conjunctivae normal.  Neck:     Musculoskeletal: Neck supple.  Cardiovascular:     Rate and Rhythm: Normal rate and regular rhythm.     Pulses: Normal pulses.     Heart sounds: Normal heart sounds.  Pulmonary:     Effort: Pulmonary effort is normal. No respiratory distress.     Breath sounds: Normal breath sounds. No wheezing or rales.  Abdominal:     General: Bowel sounds are normal. There is no distension.     Palpations: Abdomen is soft. There is no mass.     Tenderness: There is abdominal tenderness (epigastric). There is no guarding or rebound.     Hernia: No hernia is present.  Skin:    General: Skin is warm and dry.  Neurological:     General: No focal deficit present.     Mental Status: She is alert and oriented to person, place, and time.  Psychiatric:        Mood and Affect: Mood normal.        Behavior: Behavior normal.        Assessment:     Epigastric pain      Plan:     1. Lengthy discussion  with patient as I am very concerned about her symptoms and feel she needs to be urgently seen by a PCP to get her in with a GI specialist. I am unfortunately unable to refer people at this employee clinic. Pt was given  [facility] physician referral service phone number to call with help locating a PCP who is accepting new patients. She will call them today to get an appointment set up. I instructed her to let them know her symptoms so that she will be seen as soon as possible.  2. Basic labs were obtained today, CBC, CMP, and Lipase. Will notify pt of these results and will be available for her PCP to review. Reviewed results from her ED visit from 10/09/18, ultrasound and lab  work show no acute abnormalities.   3. Discussed that she needs a colonoscopy and further work up to evaluate her ongoing symptoms.  4. She will go to the ED if her symptoms worsen, if she has severe abdominal pain, chest pain, rectal bleeding, or persistent N/V. She verbalized understanding.

## 2019-01-18 ENCOUNTER — Telehealth: Payer: Self-pay | Admitting: Family Medicine

## 2019-01-18 LAB — COMPLETE METABOLIC PANEL WITH GFR
AG Ratio: 1.3 (calc) (ref 1.0–2.5)
ALT: 9 U/L (ref 6–29)
AST: 16 U/L (ref 10–35)
Albumin: 4.1 g/dL (ref 3.6–5.1)
Alkaline phosphatase (APISO): 65 U/L (ref 37–153)
BUN: 14 mg/dL (ref 7–25)
CO2: 21 mmol/L (ref 20–32)
Calcium: 9.2 mg/dL (ref 8.6–10.4)
Chloride: 104 mmol/L (ref 98–110)
Creat: 0.82 mg/dL (ref 0.50–1.05)
GFR, Est African American: 93 mL/min/{1.73_m2} (ref >=60)
GFR, Est Non African American: 81 mL/min/{1.73_m2} (ref >=60)
Globulin: 3.1 g/dL (calc) (ref 1.9–3.7)
Glucose, Bld: 72 mg/dL (ref 65–99)
Potassium: 4.6 mmol/L (ref 3.5–5.3)
Sodium: 136 mmol/L (ref 135–146)
Total Bilirubin: 0.4 mg/dL (ref 0.2–1.2)
Total Protein: 7.2 g/dL (ref 6.1–8.1)

## 2019-01-18 LAB — CBC WITH DIFFERENTIAL/PLATELET
Absolute Monocytes: 592 cells/uL (ref 200–950)
Basophils Absolute: 33 cells/uL (ref 0–200)
Basophils Relative: 0.5 %
Eosinophils Absolute: 78 cells/uL (ref 15–500)
Eosinophils Relative: 1.2 %
HCT: 32.7 % — ABNORMAL LOW (ref 35.0–45.0)
Hemoglobin: 11 g/dL — ABNORMAL LOW (ref 11.7–15.5)
Lymphs Abs: 2893 cells/uL (ref 850–3900)
MCH: 30.6 pg (ref 27.0–33.0)
MCHC: 33.6 g/dL (ref 32.0–36.0)
MCV: 90.8 fL (ref 80.0–100.0)
MPV: 9.5 fL (ref 7.5–12.5)
Monocytes Relative: 9.1 %
Neutro Abs: 2906 cells/uL (ref 1500–7800)
Neutrophils Relative %: 44.7 %
Platelets: 352 10*3/uL (ref 140–400)
RBC: 3.6 10*6/uL — ABNORMAL LOW (ref 3.80–5.10)
RDW: 12.4 % (ref 11.0–15.0)
Total Lymphocyte: 44.5 %
WBC: 6.5 10*3/uL (ref 3.8–10.8)

## 2019-01-18 LAB — SPECIMEN COMPROMISED

## 2019-01-18 LAB — LIPASE: Lipase: 27 U/L (ref 7–60)

## 2019-01-18 NOTE — Telephone Encounter (Signed)
Left voicemail on patient's cell for her to return my call. Will discuss lab results at that time.

## 2019-01-22 ENCOUNTER — Telehealth: Payer: Self-pay | Admitting: Family Medicine

## 2019-01-22 NOTE — Telephone Encounter (Signed)
Pt returned call yesterday 01/21/19. Discussed with her that her hemoglobin was slightly below normal, likely related to the blood in her stool that she has been experiencing. I strongly encouraged her to be seen as soon as possible by her PCP for further evaluation and likely referral to GI specialist. She states she has an appointment scheduled in December, however she did not tell them what symptoms she was having. I encouraged her to call them back and let them know her s/s to see if they can see her sooner. If the blood in her stool or vomit continues and she has severe abdominal pain and is unable to be seen by her PCP in a timely manner she should go to the ED, she verbalized understanding. I will call her again in a couple days to f/u.

## 2019-01-24 ENCOUNTER — Telehealth: Payer: Self-pay | Admitting: Family Medicine

## 2019-01-24 NOTE — Telephone Encounter (Signed)
F/u with pt via telephone. She states she is feeling better since starting the pantoprazole. She states earliest appointment with her PCP was 12/18. Discussed with patient that if the abdominal pain and rectal bleeding or vomiting continues or gets worse she needs to go to the emergency department for sooner evaluation. She verbalized understanding. 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

## 2019-02-22 ENCOUNTER — Ambulatory Visit: Payer: Self-pay | Admitting: Internal Medicine

## 2019-02-26 ENCOUNTER — Telehealth: Payer: Self-pay | Admitting: Family Medicine

## 2019-02-26 ENCOUNTER — Other Ambulatory Visit: Payer: Self-pay | Admitting: Family Medicine

## 2019-02-26 MED ORDER — PANTOPRAZOLE SODIUM 40 MG PO TBEC: 40.0000 mg | DELAYED_RELEASE_TABLET | Freq: Every day | ORAL | 0 refills | Status: DC

## 2019-02-26 NOTE — Telephone Encounter (Signed)
Left VM for patient. Received a refill request for her pantoprazole, one additional refill sent in. Calling to check in on her and see how she is doing with medication and see if she has been able to see her PCP as we had previously discussed.

## 2019-03-19 ENCOUNTER — Encounter (HOSPITAL_COMMUNITY): Payer: Self-pay

## 2019-03-19 ENCOUNTER — Emergency Department (HOSPITAL_COMMUNITY)
Admission: EM | Admit: 2019-03-19 | Discharge: 2019-03-19 | Disposition: A | Discharge status: Home / Self Care | Payer: PRIVATE HEALTH INSURANCE | Attending: Emergency Medicine | Admitting: Emergency Medicine

## 2019-03-19 ENCOUNTER — Other Ambulatory Visit: Payer: Self-pay

## 2019-03-19 DIAGNOSIS — Z5321 Procedure and treatment not carried out due to patient leaving prior to being seen by health care provider: Secondary | ICD-10-CM | POA: Diagnosis not present

## 2019-03-19 DIAGNOSIS — Z041 Encounter for examination and observation following transport accident: Secondary | ICD-10-CM | POA: Insufficient documentation
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
## 2019-03-20 ENCOUNTER — Encounter (HOSPITAL_COMMUNITY): Payer: Self-pay

## 2019-03-20 ENCOUNTER — Other Ambulatory Visit: Payer: Self-pay

## 2019-03-20 ENCOUNTER — Ambulatory Visit (HOSPITAL_COMMUNITY)
Admission: EM | Admit: 2019-03-20 | Discharge: 2019-03-20 | Disposition: A | Discharge status: Home / Self Care | Payer: Self-pay | Attending: Urgent Care | Admitting: Urgent Care

## 2019-03-20 DIAGNOSIS — S29012A Strain of muscle and tendon of back wall of thorax, initial encounter: Secondary | ICD-10-CM

## 2019-03-20 DIAGNOSIS — S161XXA Strain of muscle, fascia and tendon at neck level, initial encounter: Secondary | ICD-10-CM

## 2019-03-20 MED ORDER — NAPROXEN 500 MG PO TABS: 500.0000 mg | ORAL_TABLET | Freq: Two times a day (BID) | ORAL | 0 refills | Status: AC

## 2019-03-20 MED ORDER — CYCLOBENZAPRINE HCL 5 MG PO TABS: 5.0000 mg | ORAL_TABLET | Freq: Every evening | ORAL | 0 refills | Status: AC | PRN
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
## 2019-03-27 ENCOUNTER — Other Ambulatory Visit: Payer: Self-pay

## 2019-03-28 ENCOUNTER — Ambulatory Visit (INDEPENDENT_AMBULATORY_CARE_PROVIDER_SITE_OTHER): Payer: PRIVATE HEALTH INSURANCE | Admitting: Internal Medicine

## 2019-03-28 ENCOUNTER — Encounter: Payer: Self-pay | Admitting: Internal Medicine

## 2019-03-28 ENCOUNTER — Other Ambulatory Visit: Payer: Self-pay

## 2019-03-28 ENCOUNTER — Encounter: Payer: Self-pay | Admitting: Gastroenterology

## 2019-03-28 VITALS — BP 98/64 | HR 75 | Temp 97.0°F | Ht 66.0 in | Wt 149.3 lb

## 2019-03-28 DIAGNOSIS — K279 Peptic ulcer, site unspecified, unspecified as acute or chronic, without hemorrhage or perforation: Secondary | ICD-10-CM

## 2019-03-28 DIAGNOSIS — Z720 Tobacco use: Secondary | ICD-10-CM | POA: Diagnosis not present

## 2019-03-28 DIAGNOSIS — Z23 Encounter for immunization: Secondary | ICD-10-CM

## 2019-03-28 DIAGNOSIS — Z1211 Encounter for screening for malignant neoplasm of colon: Secondary | ICD-10-CM

## 2019-03-28 DIAGNOSIS — Z124 Encounter for screening for malignant neoplasm of cervix: Secondary | ICD-10-CM

## 2019-03-28 DIAGNOSIS — Z1231 Encounter for screening mammogram for malignant neoplasm of breast: Secondary | ICD-10-CM

## 2019-03-28 DIAGNOSIS — Z803 Family history of malignant neoplasm of breast: Secondary | ICD-10-CM | POA: Diagnosis not present

## 2019-03-28 MED ORDER — PANTOPRAZOLE SODIUM 40 MG PO TBEC: 40.0000 mg | DELAYED_RELEASE_TABLET | Freq: Every day | ORAL | 1 refills | Status: DC

## 2019-03-28 NOTE — Patient Instructions (Signed)
-Nice seeing you today!!  -flu, tetanus and first shingles vaccines today.  -Schedule follow up in 3 months for your physical. Please come in fasting that day.    We are committed to keeping you informed about the COVID-19 vaccine.  As the vaccine continues to become available for each phase, we will ensure that patients who meet the criteria receive the information they need to access vaccination opportunities. Continue to check your MyChart account and [email or web address] for updates. Please review the Phase 1b information below.  Following [place] [facility] guidelines for the distribution of COVID-19 vaccines we are pleased to share our plans to begin offering vaccines to those 65 and older (Phase 1b). Here are details of those plans:  [facility] COVID-19 Vaccination Clinic - [place] On Tuesday, Jan. 19, the [facility] Division of Public Health [facility]) and [place] begin large-scale COVID-19 vaccinations at the [facility]. The vaccinations are appointment only and for those 65 and older.  Walk-ins will not be accepted.  All appointments are currently filled. Please join our waiting list for the next available appointments. We will contact you when appointments become available. Please do not sign up more than once.  Join Our Waiting List   Other Vaccination Opportunities in Our Area We are also working in partnership with county health agencies in our service counties to ensure continuing vaccination availability in the weeks and months ahead. Learn more about each county's vaccination efforts in the website links below:   [place]  [place]  [place]  [facility] [place]'s phase 1b vaccination guidelines, prioritizing those 65 and over as the next eligible group to receive the COVID-19 vaccine, are detailed at [email or web address]://www.carlson.net/.   Vaccine Safety and Effectiveness Clinical trials for the Pfizer  COVID-19 vaccine involved 42,000 people and showed that the vaccine is more than 95% effective in preventing COVID-19 with no serious safety concerns. Similar results have been reported for the Moderna COVID-19 vaccine. Side effects reported in the Pfizer clinical trials include a sore arm at the injection site, fatigue, headache, chills and fever. While side effects from the Pfizer COVID-19 vaccine are higher than for a typical flu vaccine, they are lower in many ways than side effects from the leading vaccine to prevent shingles. Side effects are signs that a vaccine is working and are related to your immune system being stimulated to produce antibodies against infection. Side effects from vaccination are far less significant than health impacts from COVID-19.  Staying Informed Pharmacists, infectious disease doctors, critical care nurses and other experts at [facility] continue to speak publicly through media interviews and direct communication with our patients and communities about the safety, effectiveness and importance of vaccines to eliminate COVID-19. In addition, reliable information on vaccine safety, effectiveness, side effects and more is available on the following websites:  [place] Department of Health and Human Services COVID-19 Vaccine Information Website.  U.S. Centers for Disease Control and Prevention COVID-19 Librarian, academic.  Staying Safe We agree with the CDC on what we can do to help our communities get back to normal: Getting "back to normal" is going to take all of our tools. If we use all the tools we have, we stand the best chance of getting our families, communities, schools and workplaces "back to normal" sooner:  Get vaccinated as soon as vaccines become available within the phase of the state's vaccination rollout plan for which you meet the eligibility criteria.  Wear a  mask.  Stay 6 feet from others and avoid crowds.  Wash hands often.  For our  most current information, please visit [email or web address].

## 2019-03-28 NOTE — Addendum Note (Signed)
Addended by: Kern Reap B on: 03/28/2019 03:53 PM   Modules accepted: Orders

## 2019-03-28 NOTE — Progress Notes (Signed)
New Patient Office Visit     This visit occurred during the SARS-CoV-2 public health emergency.  Safety protocols were in place, including screening questions prior to the visit, additional usage of staff PPE, and extensive cleaning of exam room while observing appropriate contact time as indicated for disinfecting solutions.    CC/Reason for Visit: Establish care, discuss chronic conditions, medication refills Previous PCP: None Last Visit: Unknown  HPI: Anita Obrien is a 54 y.o. female who is coming in today for the above mentioned reasons. Past Medical History is significant for: Peptic ulcer disease who is supposed to be on Protonix 40 mg daily but ran out about a month ago, she is also a smoker of about 4 cigarettes a day for over 20 years, used to smoke more.  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  She has 2 daughters and 8 grandchildren.  She has no allergies, no past surgical history.  She drinks about 2 40 ounce beers a day.  Her family history significant for a sister and 3 maternal aunts who have died from breast cancer.  3 sisters have hypertension, dad has hypertension and diabetes.  For about 6 weeks she has had increasing reflux symptoms.   Past Medical/Surgical History: Past Medical History:  Diagnosis Date  . Acid reflux   . Anxiety   . Anxiety   . PUD (peptic ulcer disease)   . Tobacco abuse     History reviewed. No pertinent surgical history.  Social History:  reports that she has been smoking cigarettes. She has been smoking about 0.50 packs per day. She has never used smokeless tobacco. She reports current alcohol use of about 2.0 standard drinks of alcohol per week. She reports that she does not use drugs.  Allergies: No Known Allergies  Family History:  Family History  Problem Relation Age of Onset  . Breast cancer Sister   . Hypertension Sister   . Hypertension Father   . Diabetes Father   . Breast cancer Maternal  Aunt   . Breast cancer Maternal Aunt   . Breast cancer Maternal Aunt      Current Outpatient Medications:  .  cyclobenzaprine (FLEXERIL) 5 MG tablet, Take 1 tablet (5 mg total) by mouth at bedtime as needed for muscle spasms., Disp: 30 tablet, Rfl: 0 .  naproxen (NAPROSYN) 500 MG tablet, Take 1 tablet (500 mg total) by mouth 2 (two) times daily., Disp: 30 tablet, Rfl: 0 .  pantoprazole (PROTONIX) 40 MG tablet, Take 1 tablet (40 mg total) by mouth daily., Disp: 90 tablet, Rfl: 1  Review of Systems:  Constitutional: Denies fever, chills, diaphoresis, appetite change and fatigue.  HEENT: Denies photophobia, eye pain, redness, hearing loss, ear pain, congestion, sore throat, rhinorrhea, sneezing, mouth sores, trouble swallowing, neck pain, neck stiffness and tinnitus.   Respiratory: Denies SOB, DOE, cough, chest tightness,  and wheezing.   Cardiovascular: Denies chest pain, palpitations and leg swelling.  Gastrointestinal: Denies nausea, vomiting,  diarrhea, constipation, blood in stool and abdominal distention.  Genitourinary: Denies dysuria, urgency, frequency, hematuria, flank pain and difficulty urinating.  Endocrine: Denies: hot or cold intolerance, sweats, changes in hair or nails, polyuria, polydipsia. Musculoskeletal: Denies myalgias, back pain, joint swelling, arthralgias and gait problem.  Skin: Denies pallor, rash and wound.  Neurological: Denies dizziness, seizures, syncope, weakness, light-headedness, numbness and headaches.  Hematological: Denies adenopathy. Easy bruising, personal or family bleeding history  Psychiatric/Behavioral: Denies suicidal ideation, mood changes, confusion, nervousness, sleep  disturbance and agitation    Physical Exam: Vitals:   03/28/19 1439  BP: 98/64  Pulse: 75  Temp: (!) 97 F (36.1 C)  TempSrc: Temporal  SpO2: 98%  Weight: 149 lb 4.8 oz (67.7 kg)  Height: 5\' 6"  (1.676 m)   Body mass index is 24.1 kg/m.  Constitutional: NAD, calm,  comfortable Eyes: PERRL, lids and conjunctivae normal ENMT: Mucous membranes are moist. Respiratory: clear to auscultation bilaterally, no wheezing, no crackles. Normal respiratory effort. No accessory muscle use.  Cardiovascular: Regular rate and rhythm, no murmurs / rubs / gallops. No extremity edema.  Abdomen: no tenderness, no masses palpated. No hepatosplenomegaly. Bowel sounds positive.  Neurologic: Grossly intact and nonfocal Psychiatric: Normal judgment and insight. Alert and oriented x 3. Normal mood.    Impression and Plan:  Screening for malignant neoplasm of colon  - Plan: Ambulatory referral to Gastroenterology  Screening mammogram, encounter for  - Plan: MM Digital Screening  Screening for cervical cancer  - Plan: Ambulatory referral to Gynecology  Family history of breast cancer  - Plan: MM Digital Screening  PUD (peptic ulcer disease)  -I will refill her Protonix. -We have also discussed how her alcohol use is more than likely contributing to the issues. -If no improvement may need referral to GI for consideration of EGD.  Tobacco abuse I have discussed tobacco cessation with the patient.  I have counseled the patient regarding the negative impacts of continued tobacco use including but not limited to lung cancer, COPD, and cardiovascular disease.  I have discussed alternatives to tobacco and modalities that may help facilitate tobacco cessation including but not limited to biofeedback, hypnosis, and medications.  Total time spent with tobacco counseling was 4 minutes. -We will continue to discuss at subsequent visits.  She is not willing to quit today.      Patient Instructions  -Nice seeing you today!!  -flu, tetanus and first shingles vaccines today.  -Schedule follow up in 3 months for your physical. Please come in fasting that day.    We are committed to keeping you informed about the COVID-19 vaccine.  As the vaccine continues to become available  for each phase, we will ensure that patients who meet the criteria receive the information they need to access vaccination opportunities. Continue to check your MyChart account and [email or web address] for updates. Please review the Phase 1b information below.  Following [place]'s guidelines for the distribution of COVID-19 vaccines we are pleased to share our plans to begin offering vaccines to those 65 and older (Phase 1b). Here are details of those plans:  [place] On Tuesday, Jan. 19, the [facility]) and [place] begin large-scale COVID-19 vaccinations at the [place]. The vaccinations are appointment only and for those 24 and older.  Walk-ins will not be accepted.  All appointments are currently filled. Please join our waiting list for the next available appointments. We will contact you when appointments become available. Please do not sign up more than once.  Join Our Waiting List   Other Vaccination Opportunities in [place] We are also working in partnership with county health agencies in our service counties to ensure continuing vaccination availability in the weeks and months ahead. Learn more about each county's vaccination efforts in the website links below:   [place]'s phase 1b vaccination guidelines, prioritizing those 49  and over as the next eligible group to receive the COVID-19 vaccine, are detailed at [email or web address].carlson.net/.   Vaccine Safety and Effectiveness Clinical trials for the Pfizer COVID-19 vaccine involved 42,000 people and showed that the vaccine is more than 95% effective in preventing COVID-19 with no serious safety concerns. Similar results have been reported for the Moderna COVID-19 vaccine. Side effects reported in the Pfizer clinical trials include a sore  arm at the injection site, fatigue, headache, chills and fever. While side effects from the Pfizer COVID-19 vaccine are higher than for a typical flu vaccine, they are lower in many ways than side effects from the leading vaccine to prevent shingles. Side effects are signs that a vaccine is working and are related to your immune system being stimulated to produce antibodies against infection. Side effects from vaccination are far less significant than health impacts from COVID-19.  Staying Informed Pharmacists, infectious disease doctors, critical care nurses and other experts at [facility] continue to speak publicly through media interviews and direct communication with our patients and communities about the safety, effectiveness and importance of vaccines to eliminate COVID-19. In addition, reliable information on vaccine safety, effectiveness, side effects and more is available on the following websites:  [place] Department of Health and Human Services COVID-19 Vaccine Information Website.  U.S. Centers for Disease Control and Prevention COVID-19 Librarian, academic.  Staying Safe We agree with the CDC on what we can do to help our communities get back to normal: Getting "back to normal" is going to take all of our tools. If we use all the tools we have, we stand the best chance of getting our families, communities, schools and workplaces "back to normal" sooner:  Get vaccinated as soon as vaccines become available within the phase of the state's vaccination rollout plan for which you meet the eligibility criteria.  Wear a mask.  Stay 6 feet from others and avoid crowds.  Wash hands often.  For our most current information, please visit [email or web address]      Chaya Jan, MD [facility]

## 2019-04-18 ENCOUNTER — Telehealth: Payer: Self-pay | Admitting: *Deleted

## 2019-04-18 NOTE — Telephone Encounter (Signed)
Colonoscopy cancellation letter sent due to missed pre-visit and unable to reach patient to reschedule.

## 2019-05-02 ENCOUNTER — Telehealth: Payer: Self-pay | Admitting: *Deleted

## 2019-05-02 ENCOUNTER — Encounter: Payer: PRIVATE HEALTH INSURANCE | Admitting: Gastroenterology

## 2019-05-02 NOTE — Telephone Encounter (Signed)
Pt NS 2 pm PV-  Called pt at 208 LM to [facility] to RS PV  Called pt at 220pm- no answer- LM to return call by 5 pm to RS PV or both Pv and colon would be canceled-   No call by 5 pm- Canceled PV and colon, mailed NS letter

## 2019-05-13 ENCOUNTER — Other Ambulatory Visit: Payer: Self-pay

## 2019-05-14 ENCOUNTER — Encounter: Payer: Self-pay | Admitting: Obstetrics and Gynecology

## 2019-05-14 ENCOUNTER — Ambulatory Visit (INDEPENDENT_AMBULATORY_CARE_PROVIDER_SITE_OTHER): Payer: PRIVATE HEALTH INSURANCE | Admitting: Obstetrics and Gynecology

## 2019-05-14 VITALS — BP 122/78 | Ht 65.5 in | Wt 148.0 lb

## 2019-05-14 DIAGNOSIS — Z01419 Encounter for gynecological examination (general) (routine) without abnormal findings: Secondary | ICD-10-CM | POA: Diagnosis not present

## 2019-05-14 DIAGNOSIS — R1013 Epigastric pain: Secondary | ICD-10-CM | POA: Diagnosis not present

## 2019-05-14 DIAGNOSIS — Z1151 Encounter for screening for human papillomavirus (HPV): Secondary | ICD-10-CM | POA: Diagnosis not present

## 2019-05-14 DIAGNOSIS — Z124 Encounter for screening for malignant neoplasm of cervix: Secondary | ICD-10-CM

## 2019-05-14 NOTE — Addendum Note (Signed)
Addended by: Dayna Barker on: 05/14/2019 03:44 PM   Modules accepted: Orders

## 2019-05-14 NOTE — Progress Notes (Signed)
Anita Obrien [birth year] [id number]  SUBJECTIVE:  54 y.o. G2P2002 female new patient here for annual routine gynecologic exam and Pap smear.  She has no gynecologic concerns.  She has a history of peptic ulcers.  She has had problems with epigastric/upper abdominal pain and sounds like she has been taking Prilosec for a long time.  She was previously taking Zantac as well for that was taken off the market.  She gets pain in the upper abdomen after eating.  She indicates she does have an appointment with gastroenterology soon.  Current Outpatient Medications  Medication Sig Dispense Refill  . cyclobenzaprine (FLEXERIL) 5 MG tablet Take 1 tablet (5 mg total) by mouth at bedtime as needed for muscle spasms. 30 tablet 0  . naproxen (NAPROSYN) 500 MG tablet Take 1 tablet (500 mg total) by mouth 2 (two) times daily. 30 tablet 0   No current facility-administered medications for this visit.   Allergies: Patient has no known allergies.  Patient's last menstrual period was 08/16/2014 (exact date).  Past medical history,surgical history, problem list, medications, allergies, family history and social history were all reviewed and documented as reviewed in the EPIC chart.  ROS:  Feeling well. No dyspnea or chest pain on exertion.  + upper/epigastric abdominal pain, no change in bowel habits, black or bloody stools.  No urinary tract symptoms. GYN ROS: no abnormal bleeding, pelvic pain or discharge, no breast pain or new or enlarging lumps on self exam. No neurological complaints.    OBJECTIVE:  BP 122/78   Ht 5' 5.5" (1.664 m)   Wt 148 lb (67.1 kg)   LMP 08/16/2014 (Exact Date) Comment: GYN  BMI 24.25 kg/m  The patient appears well, alert, oriented x 3, in no distress. ENT normal.  Neck supple. No cervical or supraclavicular adenopathy or thyromegaly.  Lungs are clear, good air entry, no wheezes, rhonchi or rales. S1 and S2 normal, no murmurs, regular rate and rhythm.  Abdomen soft without  guarding, mass or organomegaly.  Tender in upper abdominal area, no tenderness in lower abdomen.   Neurological is normal, no focal findings.  BREAST EXAM: breasts appear normal, no suspicious masses, no skin or nipple changes or axillary nodes  PELVIC EXAM: VULVA: normal appearing vulva with no masses, tenderness or lesions, atrophic changes noted.  VAGINA: normal appearing vagina with normal color and discharge, no lesions, CERVIX: normal appearing cervix without discharge or lesions, UTERUS: uterus is normal size, shape, consistency and nontender, ADNEXA: normal adnexa in size, nontender and no masses, RECTAL: normal rectal, no masses, PAP: Pap smear done today, thin-prep method  Chaperone: Caryn Bee present during the examination  ASSESSMENT:  54 y.o. [id number] here for annual gynecologic exam  PLAN:   1. Postmenopausal.  No vaginal bleeding.  No significant hot flashes or other concerns. 2. Pap smear last in 2014.  No significant history of abnormal Pap smears.  Smear and HPV cotest is collected today. 3. Mammogram 2014.  Normal breast exam today.  She is reminded to schedule an annual mammogram this year when due. 4. Colonoscopy never.  Routine colon cancer screening is recommended starting at age 69 and African-American population.  She does have an upcoming appointment with GI so she will plan to discuss screening further with them, in addition to her upper abdominal pain symptoms with history of peptic ulcer. 5. DEXA never.  Would recommend one further in the menopause, by age 72. 93. Health maintenance.  No labs today as she recently had  these completed with her primary care physician in November 2020.  Return annually or sooner, prn.  Anita Majors MD  05/14/19

## 2019-05-14 NOTE — Patient Instructions (Signed)
Please call [facility] or [facility] to schedule a mammogram.

## 2019-05-16 ENCOUNTER — Encounter: Payer: PRIVATE HEALTH INSURANCE | Admitting: Gastroenterology

## 2019-05-29 LAB — PAP IG AND HPV HIGH-RISK: HPV DNA High Risk: DETECTED — AB

## 2019-05-29 LAB — HPV TYPE 16 AND 18/45 RNA
HPV Type 16 RNA: NOT DETECTED
HPV Type 18/45 RNA: NOT DETECTED

## 2019-06-27 ENCOUNTER — Encounter: Payer: PRIVATE HEALTH INSURANCE | Admitting: Internal Medicine

## 2019-09-20 IMAGING — US ULTRASOUND ABDOMEN COMPLETE
1 series · 14 of 25 positions shown · non-contrast
Comparison: None.

CLINICAL DATA: Upper abdominal pain 3 months.

EXAM:
ABDOMEN ULTRASOUND COMPLETE

[Series 1: ultrasound abdomen complete · 14 of 90 slices shown]
[im 1/90]
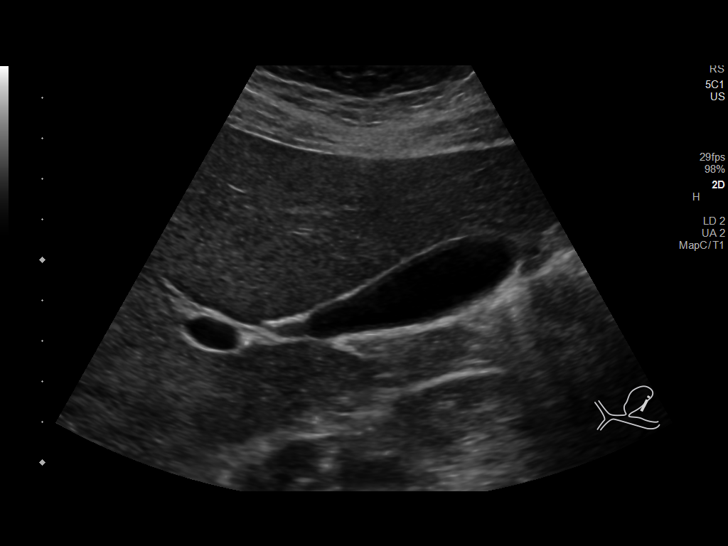
[im 8/90]
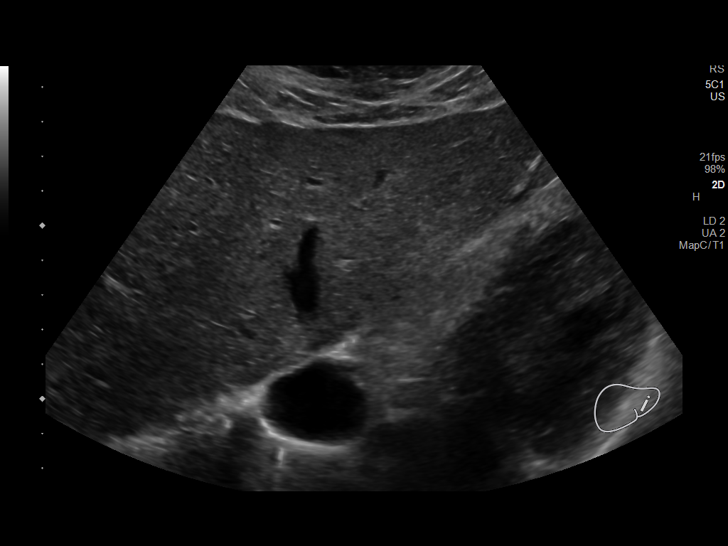
[im 15/90]
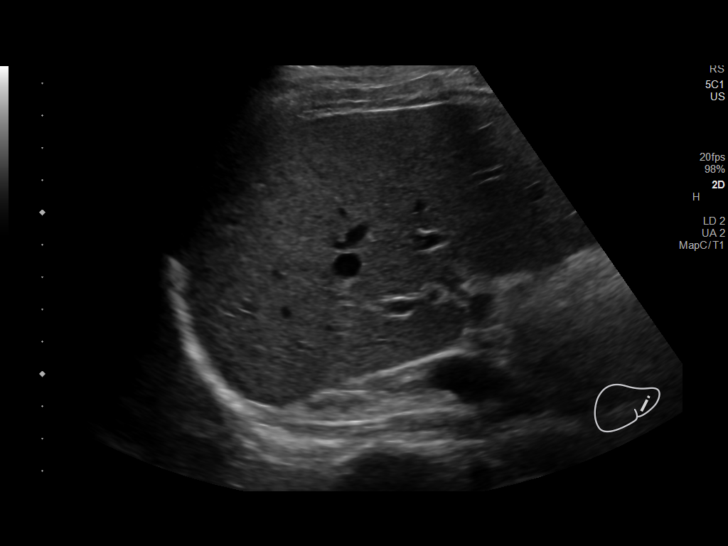
[im 23/90]
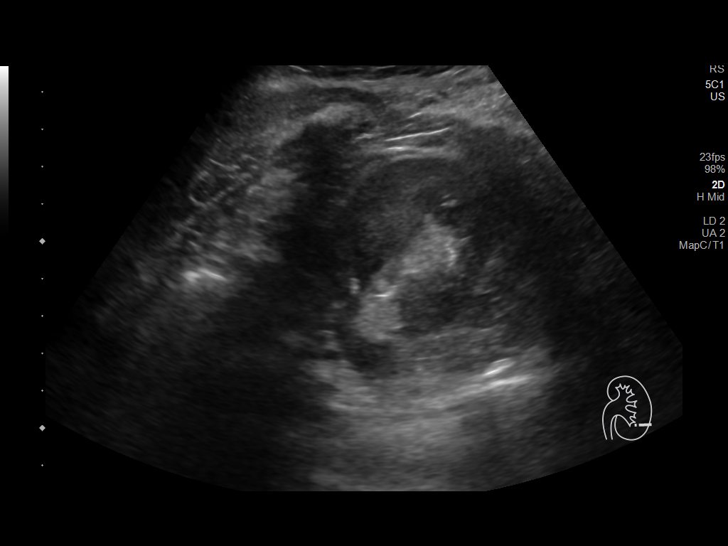
[im 30/90]
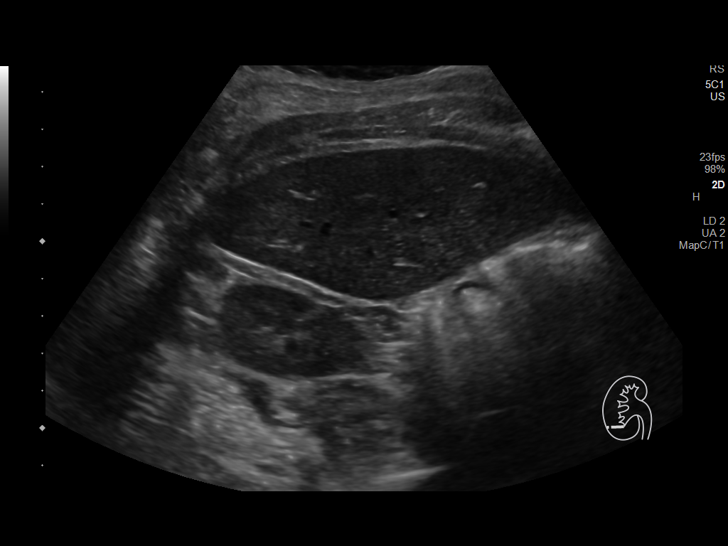
[im 34/90]
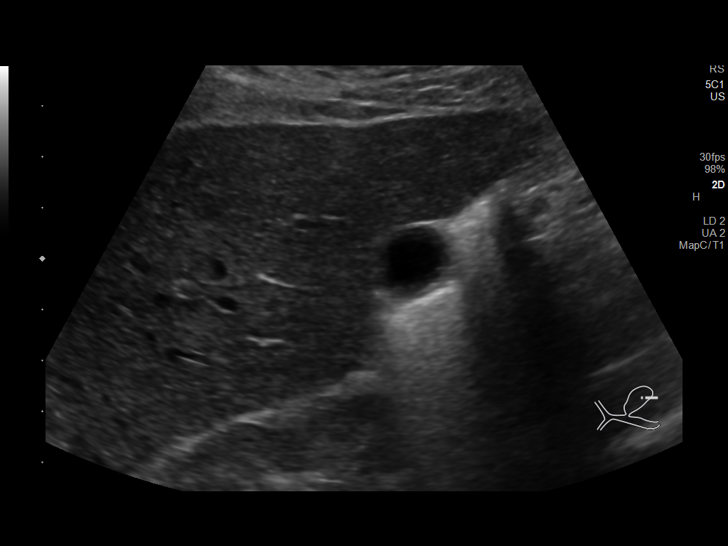
[im 41/90]
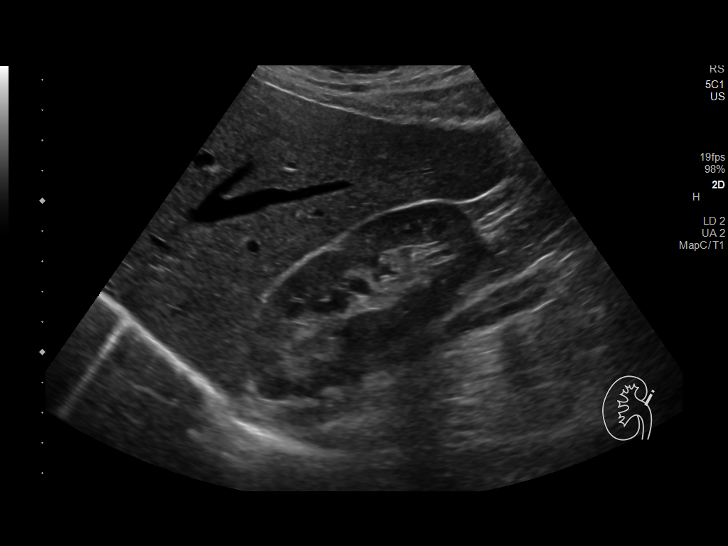
[im 49/90]
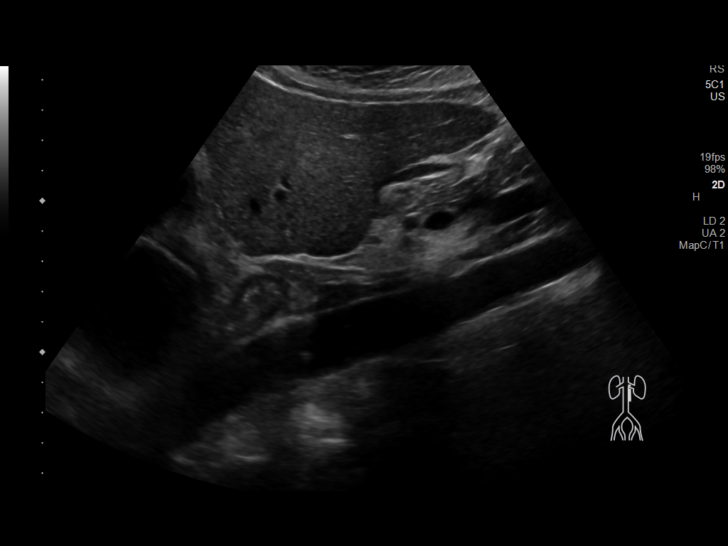
[im 56/90]
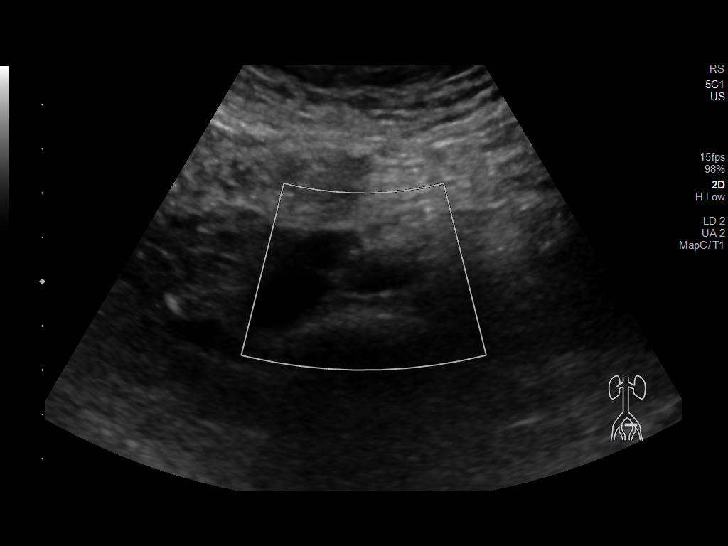
[im 60/90]
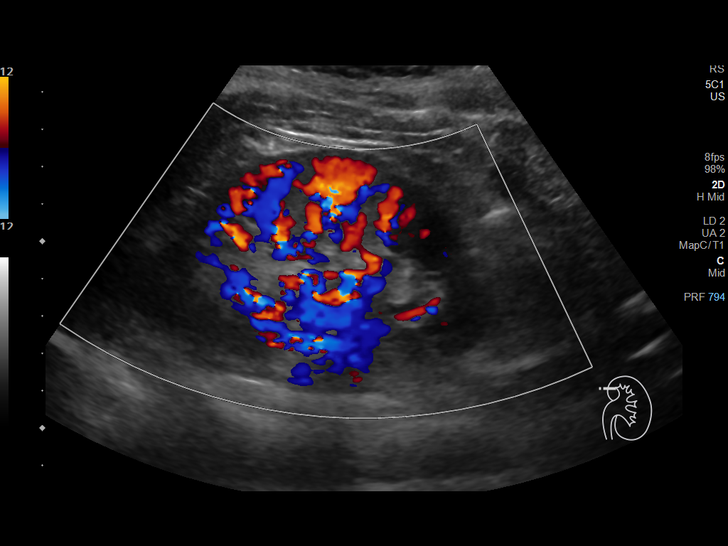
[im 67/90]
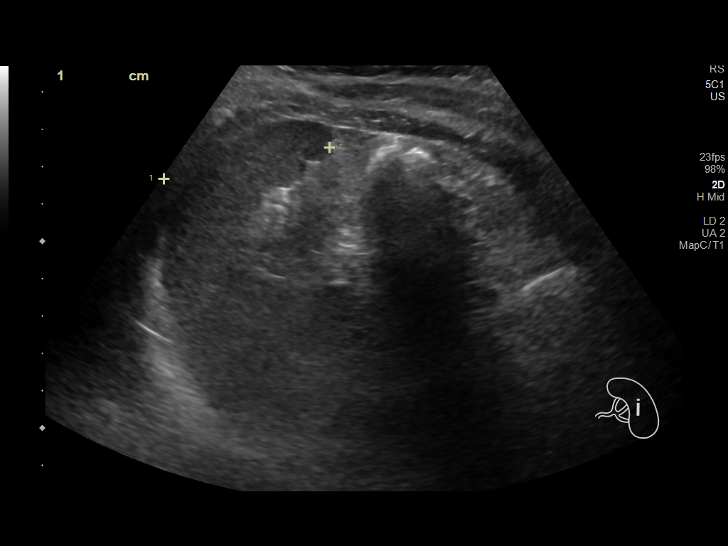
[im 75/90]
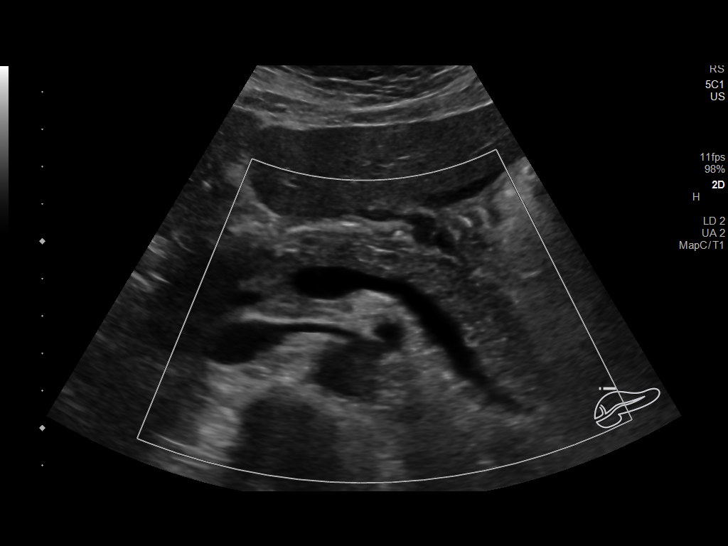
[im 82/90]
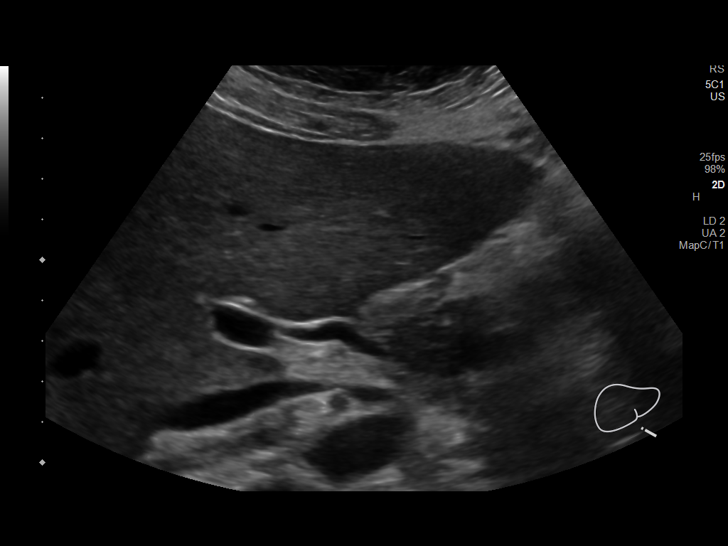
[im 90/90]
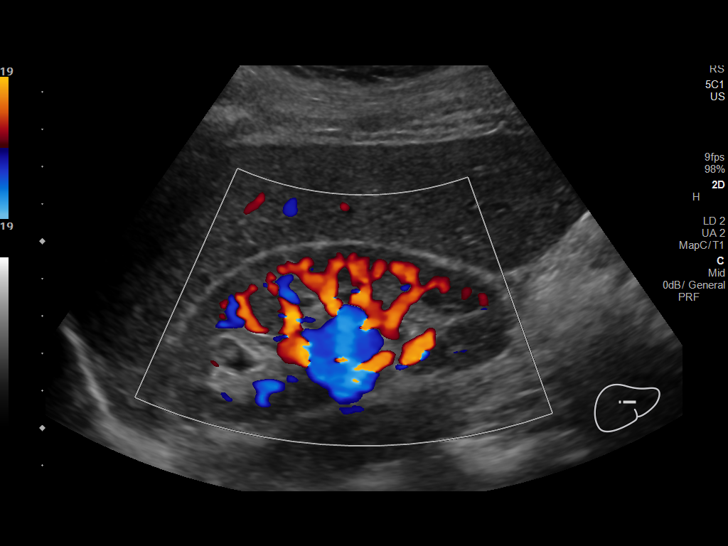

[14 of 25 positions shown; findings below may reference images not displayed]

FINDINGS: Gallbladder: No gallstones or wall thickening visualized. No
sonographic Murphy sign noted by sonographer.

Common bile duct: Diameter: 2.9 mm

Liver: No focal lesion identified. Within normal limits in
parenchymal echogenicity. Portal vein is patent on color Doppler
imaging with normal direction of blood flow towards the liver.

IVC: No abnormality visualized.

Pancreas: Heterogeneous echogenicity of the pancreas.

Spleen: Size and appearance within normal limits.

Right Kidney: Length: 9.8 cm. Echogenicity within normal limits. No
mass or hydronephrosis visualized.

Left Kidney: Length: 9.3 cm. Echogenicity within normal limits. No
mass or hydronephrosis visualized.

Abdominal aorta: No aneurysm visualized.

Other findings: None.
IMPRESSION: Heterogeneous appearance of the pancreas, which may be seen with
chronic pancreatitis. Please correlate to pancreatic enzyme values.

Otherwise normal abdominal ultrasound.

## 2020-05-14 ENCOUNTER — Encounter: Payer: PRIVATE HEALTH INSURANCE | Admitting: Obstetrics and Gynecology

## 2020-05-19 NOTE — Progress Notes (Deleted)
55 y.o. [id number] Single Black or [place] American female here for annual exam.      Patient's last menstrual period was 08/16/2014 (exact date).          Sexually active: {yes no:314532}  The current method of family planning is post menopausal status.    Exercising: {yes no:314532}  {types:19826} Smoker:  {YES [id number]  Health Maintenance: Pap:  05-14-2019 neg HPV HR+ 81,44,81 neg History of abnormal Pap:  {YES NO:22349} MMG:  01-16-13 category c density birads 1:neg Colonoscopy:  none BMD:   none TDaP:  2021 Gardasil:   *** Covid-19: *** Hep C testing: *** Screening Labs: ***   reports that she has been smoking cigarettes. She has been smoking about 0.50 packs per day. She has never used smokeless tobacco. She reports current alcohol use of about 2.0 standard drinks of alcohol per week. She reports that she does not use drugs.  Past Medical History:  Diagnosis Date  . Acid reflux   . Anxiety   . Anxiety   . PUD (peptic ulcer disease)   . Tobacco abuse     No past surgical history on file.  Current Outpatient Medications  Medication Sig Dispense Refill  . cyclobenzaprine (FLEXERIL) 5 MG tablet Take 1 tablet (5 mg total) by mouth at bedtime as needed for muscle spasms. 30 tablet 0  . naproxen (NAPROSYN) 500 MG tablet Take 1 tablet (500 mg total) by mouth 2 (two) times daily. 30 tablet 0   No current facility-administered medications for this visit.    Family History  Problem Relation Age of Onset  . Breast cancer Sister 72  . Hypertension Sister   . Hypertension Father   . Diabetes Father   . Breast cancer Maternal Aunt        Post menop.  . Breast cancer Maternal Aunt        Post menop.  . Breast cancer Maternal Aunt        Post menop    Review of Systems  Exam:   LMP 08/16/2014 (Exact Date) Comment: GYN     General appearance: alert, cooperative and appears stated age, no acute distress Head: Normocephalic, without obvious abnormality Neck: no  adenopathy, thyroid {EXAM; THYROID:18604} Lungs: clear to auscultation bilaterally Breasts: {Exam; breast:13139::"normal appearance, no masses or tenderness"} Heart: regular rate and rhythm Abdomen: soft, non-tender; no masses,  no organomegaly Extremities: extremities normal, no edema Skin: No rashes or lesions Lymph nodes: Cervical, supraclavicular, and axillary nodes normal. No abnormal inguinal nodes palpated Neurologic: Grossly normal   Pelvic: External genitalia:  no lesions              Urethra:  normal appearing urethra with no masses, tenderness or lesions              Bartholins and Skenes: normal                 Vagina: normal appearing vagina, appropriate for age, normal appearing discharge, no lesions              Cervix: neg cervical motion tenderness, no visible lesions             Bimanual Exam:   Uterus:  {exam; uterus:12215}              Adnexa: {exam; adnexa:12223}                 ***, CMA Chaperone was present for exam.  A:  Well Woman with normal exam  P:   Pap :  Mammogram:  Labs:  Medications:

## 2020-05-21 ENCOUNTER — Ambulatory Visit: Payer: PRIVATE HEALTH INSURANCE | Admitting: Nurse Practitioner

## 2021-05-16 ENCOUNTER — Other Ambulatory Visit: Payer: Self-pay

## 2021-05-16 ENCOUNTER — Encounter (HOSPITAL_COMMUNITY): Payer: Self-pay

## 2021-05-16 ENCOUNTER — Emergency Department (HOSPITAL_COMMUNITY)
Admission: EM | Admit: 2021-05-16 | Discharge: 2021-05-16 | Disposition: A | Discharge status: Home / Self Care | Payer: PRIVATE HEALTH INSURANCE | Attending: Emergency Medicine | Admitting: Emergency Medicine

## 2021-05-16 ENCOUNTER — Emergency Department (HOSPITAL_COMMUNITY): Payer: PRIVATE HEALTH INSURANCE

## 2021-05-16 DIAGNOSIS — R0789 Other chest pain: Secondary | ICD-10-CM | POA: Diagnosis not present

## 2021-05-16 DIAGNOSIS — M25562 Pain in left knee: Secondary | ICD-10-CM | POA: Insufficient documentation

## 2021-05-16 DIAGNOSIS — J069 Acute upper respiratory infection, unspecified: Secondary | ICD-10-CM | POA: Diagnosis not present

## 2021-05-16 DIAGNOSIS — R079 Chest pain, unspecified: Secondary | ICD-10-CM

## 2021-05-16 DIAGNOSIS — F17200 Nicotine dependence, unspecified, uncomplicated: Secondary | ICD-10-CM | POA: Insufficient documentation

## 2021-05-16 DIAGNOSIS — Z20822 Contact with and (suspected) exposure to covid-19: Secondary | ICD-10-CM | POA: Insufficient documentation

## 2021-05-16 LAB — BASIC METABOLIC PANEL
Anion gap: 8 (ref 5–15)
BUN: 8 mg/dL (ref 6–20)
CO2: 22 mmol/L (ref 22–32)
Calcium: 9.2 mg/dL (ref 8.9–10.3)
Chloride: 106 mmol/L (ref 98–111)
Creatinine, Ser: 0.88 mg/dL (ref 0.44–1.00)
GFR, Estimated: >60 mL/min (ref >60)
Glucose, Bld: 112 mg/dL — ABNORMAL HIGH (ref 70–99)
Potassium: 3.7 mmol/L (ref 3.5–5.1)
Sodium: 136 mmol/L (ref 135–145)

## 2021-05-16 LAB — TROPONIN I (HIGH SENSITIVITY)
Troponin I (High Sensitivity): 3 ng/L (ref <18)
Troponin I (High Sensitivity): 3 ng/L (ref <18)

## 2021-05-16 LAB — CBC
HCT: 40.4 % (ref 36.0–46.0)
Hemoglobin: 13.6 g/dL (ref 12.0–15.0)
MCH: 32 pg (ref 26.0–34.0)
MCHC: 33.7 g/dL (ref 30.0–36.0)
MCV: 95.1 fL (ref 80.0–100.0)
Platelets: 336 10*3/uL (ref 150–400)
RBC: 4.25 MIL/uL (ref 3.87–5.11)
RDW: 13 % (ref 11.5–15.5)
WBC: 6.9 10*3/uL (ref 4.0–10.5)
nRBC: 0 % (ref 0.0–0.2)

## 2021-05-16 LAB — RESP PANEL BY RT-PCR (FLU A&B, COVID) ARPGX2
Influenza A by PCR: NEGATIVE
Influenza B by PCR: NEGATIVE
SARS Coronavirus 2 by RT PCR: NEGATIVE

## 2021-05-16 NOTE — ED Provider Notes (Signed)
?[facility] EMERGENCY DEPARTMENT ?Provider Note ? ? ?CSN: [id number] ?Arrival date & time: 05/16/21  1041 ? ?  ? ?History ? ?Chief Complaint  ?Patient presents with  ? Chest Pain  ? Knee Pain  ? Shortness of Breath  ? ? ?Anita Obrien is a 56 y.o. female. ? ? ?Chest Pain ?Associated symptoms: shortness of breath   ?Associated symptoms: no back pain and no weakness   ?Knee Pain ?Associated symptoms: no back pain   ?Shortness of Breath ?Associated symptoms: chest pain   ?Patient presents with anterior chest pain.  This morning woke with the pain.  Had been doing fine yesterday.  Works and has had no problems with that.  Has a little bit of a nonproductive cough.  Pain is in the anterior chest right near her sternum.  No swelling in her legs.  States she has some slight pain in her left knee.  This did not involve any trauma.  No swelling in the legs.  Does smoke.  No fevers or chills.  No known sick contacts.  Does feel mildly short of breath. ?  ? ?Home Medications ?Prior to Admission medications   ?Medication Sig Start Date End Date Taking? Authorizing Provider  ?cyclobenzaprine (FLEXERIL) 5 MG tablet Take 1 tablet (5 mg total) by mouth at bedtime as needed for muscle spasms. 03/20/19  Yes Wallis Bamberg, PA-C  ?naproxen (NAPROSYN) 500 MG tablet Take 1 tablet (500 mg total) by mouth 2 (two) times daily. 03/20/19  Yes Wallis Bamberg, PA-C  ?   ? ?Allergies    ?Patient has no known allergies.   ? ?Review of Systems   ?Review of Systems  ?Constitutional:  Negative for appetite change.  ?Respiratory:  Positive for shortness of breath.   ?Cardiovascular:  Positive for chest pain.  ?Musculoskeletal:  Negative for back pain.  ?     Left knee pain.  ?Neurological:  Negative for weakness.  ? ?Physical Exam ?Updated Vital Signs ?BP 130/75   Pulse 69   Temp 97.9 ?F (36.6 ?C)   Resp 16   Ht 5\' 4"  (1.626 m)   Wt 68 kg   LMP 08/16/2014 (Exact Date) Comment: GYN  SpO2 98%   BMI 25.75 kg/m?  ?Physical  Exam ?Vitals and nursing note reviewed.  ?HENT:  ?   Head: Normocephalic.  ?Cardiovascular:  ?   Rate and Rhythm: Normal rate and regular rhythm.  ?Pulmonary:  ?   Breath sounds: No wheezing.  ?Chest:  ?   Chest wall: Tenderness present.  ?   Comments: Tenderness over lower sternum anteriorly. ?Musculoskeletal:  ?   Right lower leg: No edema.  ?   Left lower leg: No edema.  ?Skin: ?   General: Skin is warm.  ?Neurological:  ?   Mental Status: She is alert.  ? ? ?ED Results / Procedures / Treatments   ?Labs ?(all labs ordered are listed, but only abnormal results are displayed) ?Labs Reviewed  ?BASIC METABOLIC PANEL - Abnormal; Notable for the following components:  ?    Result Value  ? Glucose, Bld 112 (*)   ? All other components within normal limits  ?RESP PANEL BY RT-PCR (FLU A&B, COVID) ARPGX2  ?CBC  ?TROPONIN I (HIGH SENSITIVITY)  ?TROPONIN I (HIGH SENSITIVITY)  ? ? ?EKG ?EKG Interpretation ? ?Date/Time:  Sunday May 16 2021 10:35:30 EDT ?Ventricular Rate:  79 ?PR Interval:  144 ?QRS Duration: 124 ?QT Interval:  458 ?QTC Calculation: 525 ?R Axis:  47 ?Text Interpretation: Normal sinus rhythm Left bundle branch block Abnormal ECG When compared with ECG of 06-Mar-2018 12:19, PREVIOUS ECG IS PRESENT LBBB is new since last tracing Confirmed by Benjiman Core [phone]) on 05/16/2021 11:09:54 AM ? ?Radiology ?DG Chest 2 View ? ?Result Date: 05/16/2021 ?CLINICAL DATA:  Chest pain EXAM: CHEST - 2 VIEW COMPARISON:  03/06/2018 FINDINGS: Normal heart size and mediastinal contours. No acute infiltrate or edema. No effusion or pneumothorax. No acute osseous findings. IMPRESSION: Negative chest. Electronically Signed   By: Tiburcio Pea M.D.   On: 05/16/2021 11:29   ? ?Procedures ?Procedures  ? ? ?Medications Ordered in ED ?Medications - No data to display ? ?ED Course/ Medical Decision Making/ A&P ?  ?                        ?Medical Decision Making ?Amount and/or Complexity of Data Reviewed ?Labs: ordered. ?Radiology:  ordered. ? ? ?Patient presents with chest pain.  Anterior chest.  Initial differential gnosis for chest pain or shortness of breath is long and includes life-threatening condition such as coronary disease, pneumonia, pulm embolism.  Not exertional.  No swelling her legs.  Does have slight knee pain but only mild tenderness.  No swelling.  Doubt DVT.  EKG independently interpreted and reassuring although does show new left bundle branch block since 4 years ago.  Has not had any exertional components.  No recent travel.  Is at high risk due to smoking.  Has had a cough including some coughing while I was in the room with her. ? ?Troponin negative x2.  Doubt cardiac ischemia as the cause.  Tenderness on the knee and doubt pulmonary embolism.  Appears low enough risk for discharge home.  Chest x-ray independently interpreted showed no pneumonia.  Discharge home. ? ? ? ? ? ? ? ?Final Clinical Impression(s) / ED Diagnoses ?Final diagnoses:  ?Upper respiratory tract infection, unspecified type  ?Nonspecific chest pain  ? ? ?Rx / DC Orders ?ED Discharge Orders   ? ? None  ? ?  ? ? ?  ?Benjiman Core, MD ?05/16/21 2041 ? ?

## 2021-05-16 NOTE — ED Triage Notes (Signed)
Pt. Stated, I started having chest pain this morning with SOB also my left knee hurts. ?

## 2022-04-27 IMAGING — DX DG CHEST 2V
2 series · 2 of 2 positions shown · non-contrast
Comparison: 03/06/2018

CLINICAL DATA: Chest pain

EXAM:
CHEST - 2 VIEW

[chest pa]
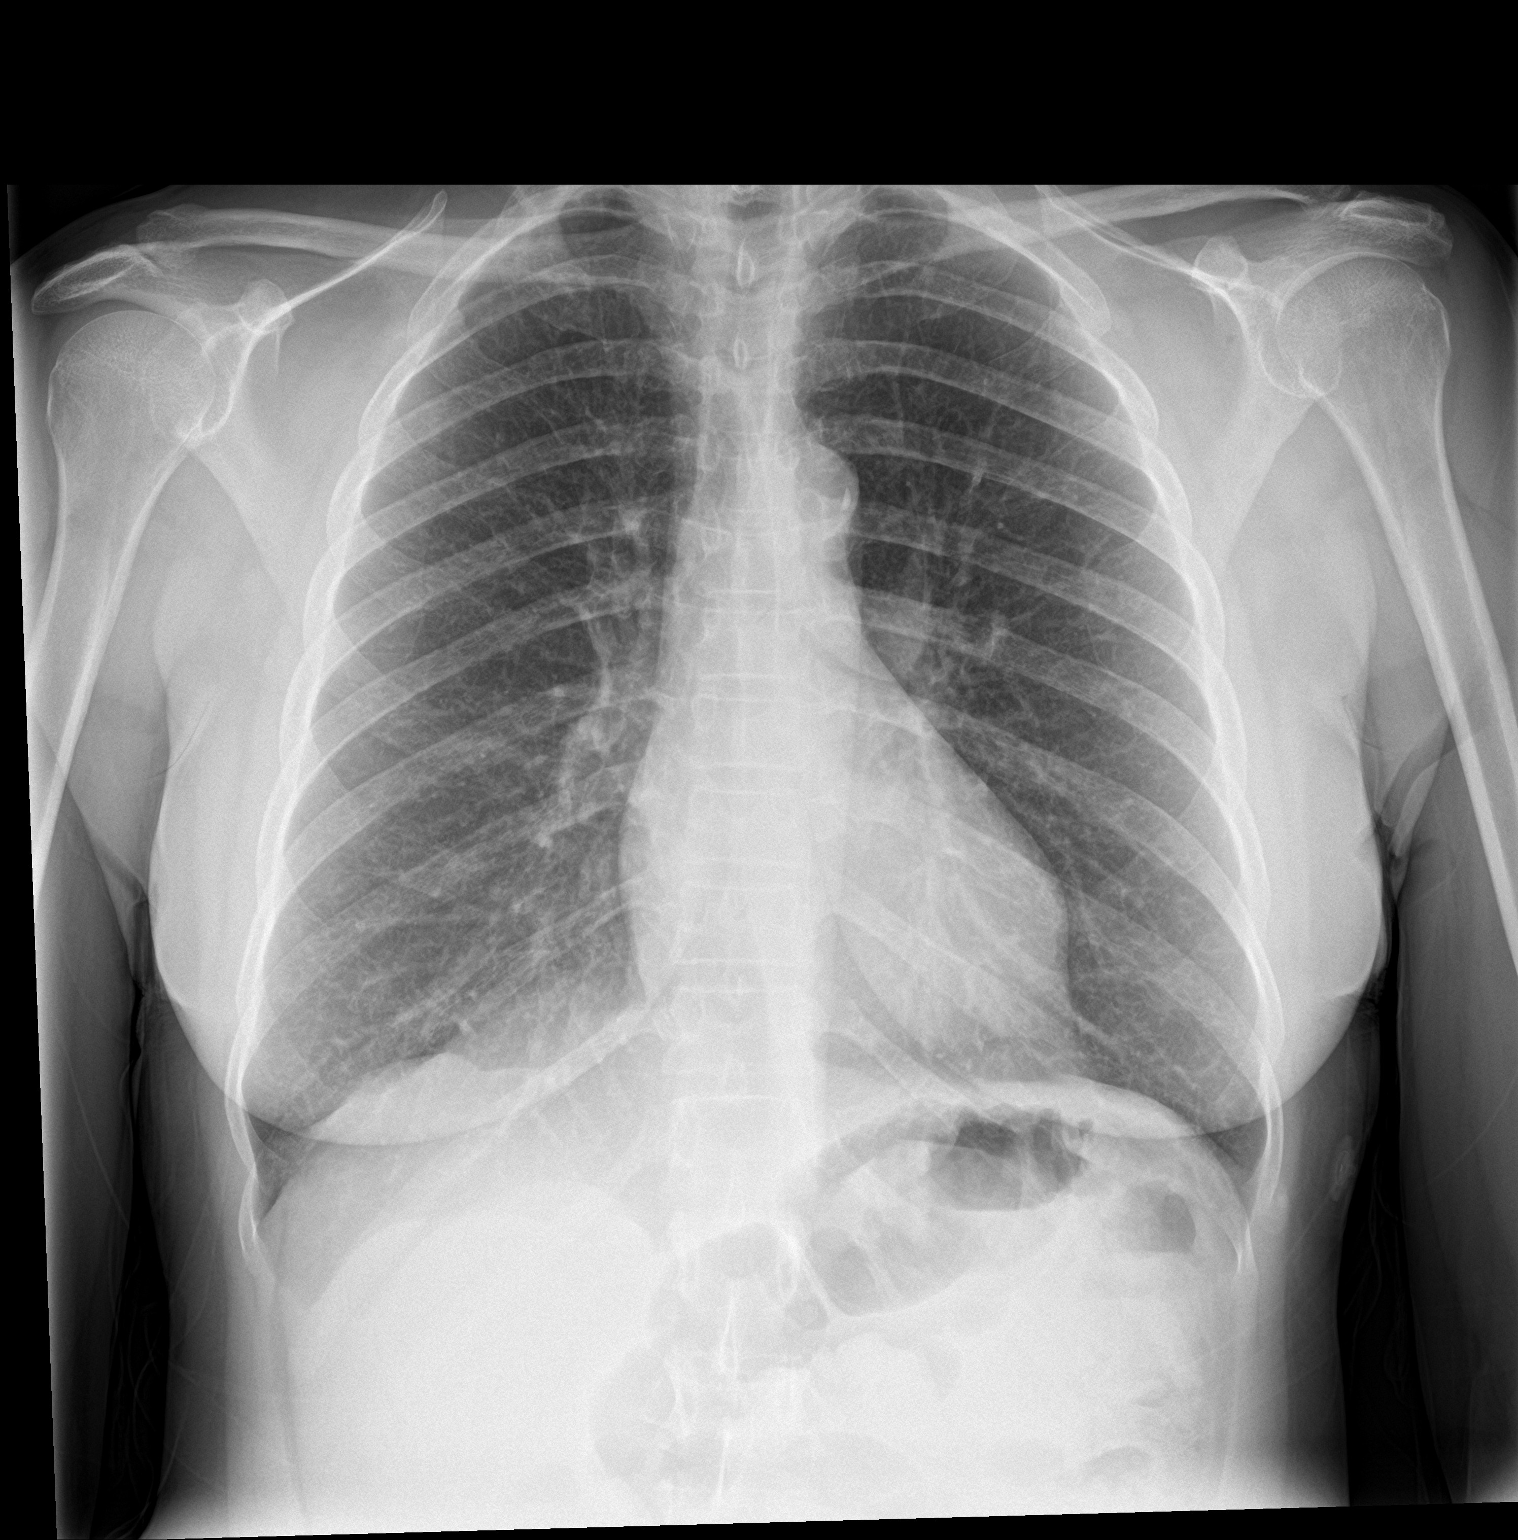

[chest lat]
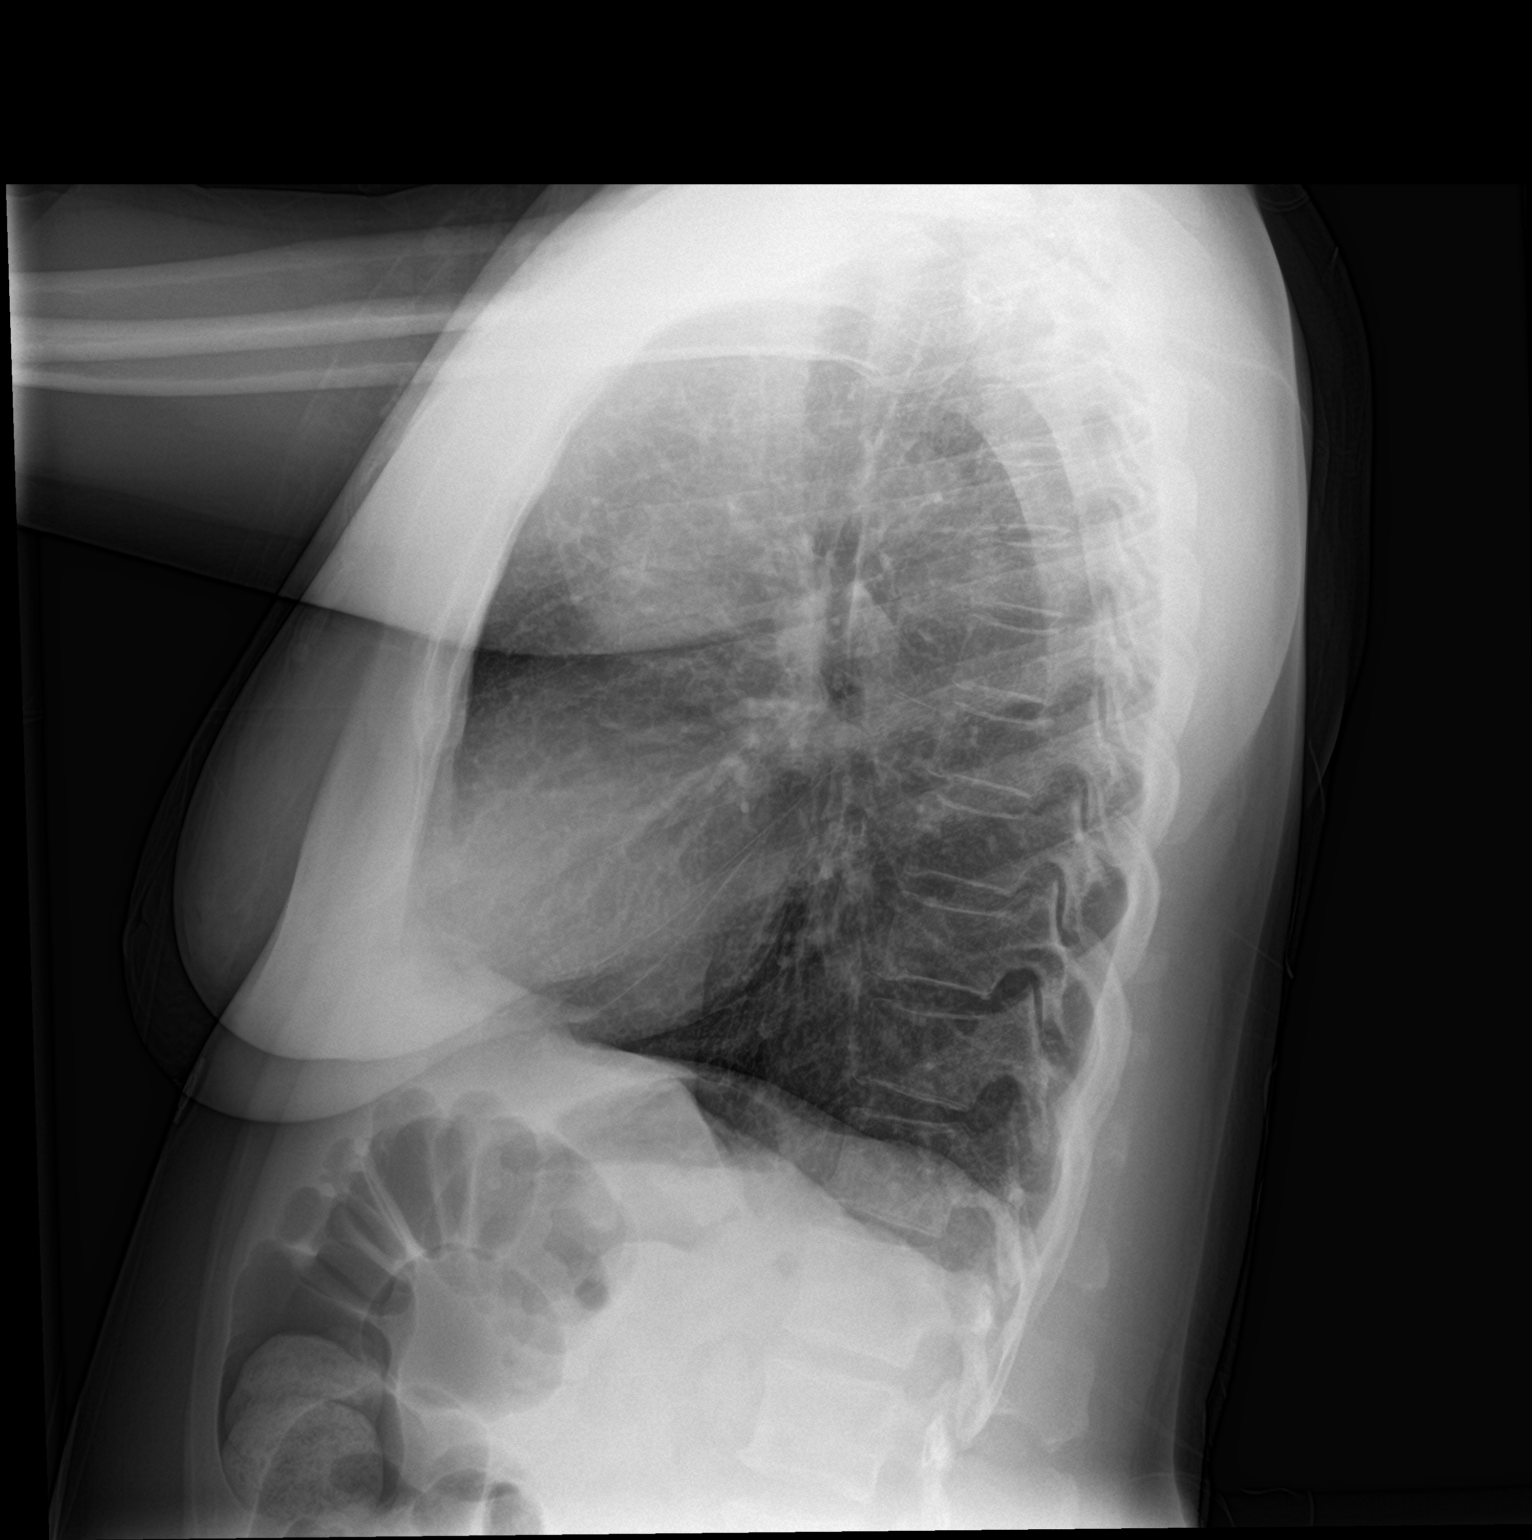

[2 of 2 positions shown; findings below may reference images not displayed]

FINDINGS: Normal heart size and mediastinal contours. No acute infiltrate or
edema. No effusion or pneumothorax. No acute osseous findings.
IMPRESSION: Negative chest.
